# Patient Record
Sex: Male | Born: 1970 | Race: White | Hispanic: No | Marital: Married | Smoking: Current every day smoker
Health system: Southern US, Community
[De-identification: ages and names within clinical notes are randomized; demographics above are authoritative.]

---

## 2008-07-04 ENCOUNTER — Emergency Department (HOSPITAL_COMMUNITY): Admission: EM | Admit: 2008-07-04 | Discharge: 2008-07-04 | Payer: Self-pay | Admitting: Emergency Medicine

## 2010-05-08 ENCOUNTER — Emergency Department (HOSPITAL_COMMUNITY)
Admission: EM | Admit: 2010-05-08 | Discharge: 2010-05-08 | Payer: Self-pay | Source: Home / Self Care | Admitting: Emergency Medicine

## 2010-08-26 ENCOUNTER — Emergency Department (HOSPITAL_COMMUNITY)
Admission: EM | Admit: 2010-08-26 | Discharge: 2010-08-27 | Disposition: A | Discharge status: Home / Self Care | Payer: Self-pay | Attending: Emergency Medicine | Admitting: Emergency Medicine

## 2010-08-26 DIAGNOSIS — J45909 Unspecified asthma, uncomplicated: Secondary | ICD-10-CM | POA: Insufficient documentation

## 2011-03-05 ENCOUNTER — Emergency Department (HOSPITAL_COMMUNITY)
Admission: EM | Admit: 2011-03-05 | Discharge: 2011-03-05 | Disposition: A | Discharge status: Home / Self Care | Payer: Self-pay | Attending: Emergency Medicine | Admitting: Emergency Medicine

## 2011-03-05 DIAGNOSIS — R0602 Shortness of breath: Secondary | ICD-10-CM | POA: Insufficient documentation

## 2011-03-05 DIAGNOSIS — J45909 Unspecified asthma, uncomplicated: Secondary | ICD-10-CM | POA: Insufficient documentation

## 2011-03-16 ENCOUNTER — Emergency Department (HOSPITAL_COMMUNITY): Payer: Self-pay

## 2011-03-16 ENCOUNTER — Emergency Department (HOSPITAL_COMMUNITY)
Admission: EM | Admit: 2011-03-16 | Discharge: 2011-03-16 | Disposition: A | Discharge status: Home / Self Care | Payer: Self-pay | Attending: Emergency Medicine | Admitting: Emergency Medicine

## 2011-03-16 DIAGNOSIS — J45909 Unspecified asthma, uncomplicated: Secondary | ICD-10-CM | POA: Insufficient documentation

## 2011-05-02 ENCOUNTER — Emergency Department (HOSPITAL_COMMUNITY)
Admission: EM | Admit: 2011-05-02 | Discharge: 2011-05-02 | Disposition: A | Discharge status: Home / Self Care | Payer: Self-pay | Attending: Emergency Medicine | Admitting: Emergency Medicine

## 2011-05-02 ENCOUNTER — Other Ambulatory Visit: Payer: Self-pay

## 2011-05-02 ENCOUNTER — Encounter: Payer: Self-pay | Admitting: Emergency Medicine

## 2011-05-02 DIAGNOSIS — J45901 Unspecified asthma with (acute) exacerbation: Secondary | ICD-10-CM

## 2011-05-02 DIAGNOSIS — R0602 Shortness of breath: Secondary | ICD-10-CM | POA: Insufficient documentation

## 2011-05-02 DIAGNOSIS — R0789 Other chest pain: Secondary | ICD-10-CM | POA: Insufficient documentation

## 2011-05-02 MED ORDER — ALBUTEROL SULFATE HFA 108 (90 BASE) MCG/ACT IN AERS
2.0000 | INHALATION_SPRAY | Freq: Four times a day (QID) | RESPIRATORY_TRACT | Status: DC | PRN
  Administered 2011-05-02: 2 via RESPIRATORY_TRACT
  Filled 2011-05-02: qty 6.7

## 2011-05-02 MED ORDER — ALBUTEROL SULFATE (5 MG/ML) 0.5% IN NEBU
2.5000 mg | INHALATION_SOLUTION | Freq: Once | RESPIRATORY_TRACT | Status: AC
  Administered 2011-05-02: 5 mg via RESPIRATORY_TRACT
  Administered 2011-05-02: 02:00:00 via RESPIRATORY_TRACT

## 2011-05-02 MED ORDER — ALBUTEROL SULFATE (5 MG/ML) 0.5% IN NEBU
INHALATION_SOLUTION | RESPIRATORY_TRACT | Status: AC
  Filled 2011-05-02: qty 1

## 2011-05-02 MED ORDER — ALBUTEROL SULFATE HFA 108 (90 BASE) MCG/ACT IN AERS: 1.0000 | INHALATION_SPRAY | Freq: Four times a day (QID) | RESPIRATORY_TRACT | Status: AC | PRN

## 2011-05-02 NOTE — ED Provider Notes (Signed)
History     CSN: [id number] Arrival date & time: 05/02/2011  2:15 AM   First MD Initiated Contact with Patient 05/02/11 0303      Chief Complaint  Patient presents with  . Shortness of Breath    (Consider location/radiation/quality/duration/timing/severity/associated sxs/prior treatment) HPI Comments: Patient reports this is a mild asthma attack compared to other attacks. He does not smoke. He denies any fever, cold symptoms. He did not have an inhaler because he ran out and could not afford one at the time. He's reports he tried to drink warm liquids and use steam to see if he could get some relief but could not therefore had to come to the emergency department. He did receive a nebulizer treatment prior to my evaluation and the patient reports he feels much improved already. He does not think that he requires steroids with this attack. He did have mild chest tightness but it is consistent with his prior asthma attacks. He did have sweats or vomiting. Currently he does not have any significant chest tightness after his albuterol treatment.  Patient is a 40 y.o. male presenting with shortness of breath. The history is provided by the patient.  Shortness of Breath  The current episode started today. The problem is moderate. The symptoms are relieved by nothing. Associated symptoms include shortness of breath and wheezing.    Past Medical History  Diagnosis Date  . Asthma     History reviewed. No pertinent past surgical history.  History reviewed. No pertinent family history.  History  Substance Use Topics  . Smoking status: Never Smoker   . Smokeless tobacco: Not on file  . Alcohol Use: No      Review of Systems  Respiratory: Positive for chest tightness, shortness of breath and wheezing.   All other systems reviewed and are negative.    Allergies  Review of patient's allergies indicates no known allergies.  Home Medications   Current Outpatient Rx  Name Route Sig  Dispense Refill  . ALBUTEROL SULFATE HFA 108 (90 BASE) MCG/ACT IN AERS Inhalation Inhale 1-2 puffs into the lungs every 6 (six) hours as needed for wheezing. 1 Inhaler 0    BP 112/82  Pulse 89  Temp(Src) 97.6 F (36.4 C) (Axillary)  Resp 15  SpO2 94%  Physical Exam  Constitutional: He is oriented to person, place, and time. He appears well-developed and well-nourished.  HENT:  Head: Normocephalic and atraumatic.  Cardiovascular: Normal rate.   Pulmonary/Chest: Effort normal. He has wheezes.  Abdominal: Soft.  Neurological: He is alert and oriented to person, place, and time.  Skin: Skin is warm and dry. No rash noted.  Psychiatric: He has a normal mood and affect.    ED Course  Procedures (including critical care time)  Labs Reviewed - No data to display No results found.   1. Asthma attack       MDM    Wheezing is minmal, sats improved from low of 90% to 94% now on RA which is ok.  Will give inhaler and prescription for home as well.  I recommended that he get vaccine for flu.          Gavin Pound. Oletta Lamas, MD 05/02/11 [phone]

## 2011-05-02 NOTE — ED Notes (Signed)
INITIAL ALBUTEROL NEBULIZER TREATMENT GIVEN AT TRIAGE.

## 2011-05-02 NOTE — Discharge Instructions (Signed)
Asthma Attack Prevention HOW CAN ASTHMA BE PREVENTED? Currently, there is no way to prevent asthma from starting. However, you can take steps to control the disease and prevent its symptoms after you have been diagnosed. Learn about your asthma and how to control it. Take an active role to control your asthma by working with your caregiver to create and follow an asthma action plan. An asthma action plan guides you in taking your medicines properly, avoiding factors that make your asthma worse, tracking your level of asthma control, responding to worsening asthma, and seeking emergency care when needed. To track your asthma, keep records of your symptoms, check your peak flow number using a peak flow meter (handheld device that shows how well air moves out of your lungs), and get regular asthma checkups.  Other ways to prevent asthma attacks include:  Use medicines as your caregiver directs.   Identify and avoid things that make your asthma worse (as much as you can).   Keep track of your asthma symptoms and level of control.   Get regular checkups for your asthma.   With your caregiver, write a detailed plan for taking medicines and managing an asthma attack. Then be sure to follow your action plan. Asthma is an ongoing condition that needs regular monitoring and treatment.   Identify and avoid asthma triggers. A number of outdoor allergens and irritants (pollen, mold, cold air, air pollution) can trigger asthma attacks. Find out what causes or makes your asthma worse, and take steps to avoid those triggers (see below).   Monitor your breathing. Learn to recognize warning signs of an attack, such as slight coughing, wheezing or shortness of breath. However, your lung function may already decrease before you notice any signs or symptoms, so regularly measure and record your peak airflow with a home peak flow meter.   Identify and treat attacks early. If you act quickly, you're less likely to have  a severe attack. You will also need less medicine to control your symptoms. When your peak flow measurements decrease and alert you to an upcoming attack, take your medicine as instructed, and immediately stop any activity that may have triggered the attack. If your symptoms do not improve, get medical help.   Pay attention to increasing quick-relief inhaler use. If you find yourself relying on your quick-relief inhaler (such as albuterol), your asthma is not under control. See your caregiver about adjusting your treatment.  IDENTIFY AND CONTROL FACTORS THAT MAKE YOUR ASTHMA WORSE A number of common things can set off or make your asthma symptoms worse (asthma triggers). Keep track of your asthma symptoms for several weeks, detailing all the environmental and emotional factors that are linked with your asthma. When you have an asthma attack, go back to your asthma diary to see which factor, or combination of factors, might have contributed to it. Once you know what these factors are, you can take steps to control many of them.  Allergies: If you have allergies and asthma, it is important to take asthma prevention steps at home. Asthma attacks (worsening of asthma symptoms) can be triggered by allergies, which can cause temporary increased inflammation of your airways. Minimizing contact with the substance to which you are allergic will help prevent an asthma attack. Animal Dander:   Some people are allergic to the flakes of skin or dried saliva from animals with fur or feathers. Keep these pets out of your home.   If you can't keep a pet outdoors, keep the   pet out of your bedroom and other sleeping areas at all times, and keep the door closed.   Remove carpets and furniture covered with cloth from your home. If that is not possible, keep the pet away from fabric-covered furniture and carpets.  Dust Mites:  Many people with asthma are allergic to dust mites. Dust mites are tiny bugs that are found in  every home, in mattresses, pillows, carpets, fabric-covered furniture, bedcovers, clothes, stuffed toys, fabric, and other fabric-covered items.   Cover your mattress in a special dust-proof cover.   Cover your pillow in a special dust-proof cover, or wash the pillow each week in hot water. Water must be hotter than 130 F to kill dust mites. Cold or warm water used with detergent and bleach can also be effective.   Wash the sheets and blankets on your bed each week in hot water.   Try not to sleep or lie on cloth-covered cushions.   Call ahead when traveling and ask for a smoke-free hotel room. Bring your own bedding and pillows, in case the hotel only supplies feather pillows and down comforters, which may contain dust mites and cause asthma symptoms.   Remove carpets from your bedroom and those laid on concrete, if you can.   Keep stuffed toys out of the bed, or wash the toys weekly in hot water or cooler water with detergent and bleach.  Cockroaches:  Many people with asthma are allergic to the droppings and remains of cockroaches.   Keep food and garbage in closed containers. Never leave food out.   Use poison baits, traps, powders, gels, or paste (for example, boric acid).   If a spray is used to kill cockroaches, stay out of the room until the odor goes away.  Indoor Mold:  Fix leaky faucets, pipes, or other sources of water that have mold around them.   Clean moldy surfaces with a cleaner that has bleach in it.  Pollen and Outdoor Mold:  When pollen or mold spore counts are high, try to keep your windows closed.   Stay indoors with windows closed from late morning to afternoon, if you can. Pollen and some mold spore counts are highest at that time.   Ask your caregiver whether you need to take or increase anti-inflammatory medicine before your allergy season starts.  Irritants:   Tobacco smoke is an irritant. If you smoke, ask your caregiver how you can quit. Ask family  members to quit smoking, too. Do not allow smoking in your home or car.   If possible, do not use a wood-burning stove, kerosene heater, or fireplace. Minimize exposure to all sources of smoke, including incense, candles, fires, and fireworks.   Try to stay away from strong odors and sprays, such as perfume, talcum powder, hair spray, and paints.   Decrease humidity in your home and use an indoor air cleaning device. Reduce indoor humidity to below 60 percent. Dehumidifiers or central air conditioners can do this.   Try to have someone else vacuum for you once or twice a week, if you can. Stay out of rooms while they are being vacuumed and for a short while afterward.   If you vacuum, use a dust mask from a hardware store, a double-layered or microfilter vacuum cleaner bag, or a vacuum cleaner with a HEPA filter.   Sulfites in foods and beverages can be irritants. Do not drink beer or wine, or eat dried fruit, processed potatoes, or shrimp if they cause asthma   symptoms.   Cold air can trigger an asthma attack. Cover your nose and mouth with a scarf on cold or windy days.   Several health conditions can make asthma more difficult to manage, including runny nose, sinus infections, reflux disease, psychological stress, and sleep apnea. Your caregiver will treat these conditions, as well.   Avoid close contact with people who have a cold or the flu, since your asthma symptoms may get worse if you catch the infection from them. Wash your hands thoroughly after touching items that may have been handled by people with a respiratory infection.   Get a flu shot every year to protect against the flu virus, which often makes asthma worse for days or weeks. Also get a pneumonia shot once every five to 10 years.  Drugs:  Aspirin and other painkillers can cause asthma attacks. 10% to 20% of people with asthma have sensitivity to aspirin or a group of painkillers called non-steroidal anti-inflammatory drugs  (NSAIDS), such as ibuprofen and naproxen. These drugs are used to treat pain and reduce fevers. Asthma attacks caused by any of these medicines can be severe and even fatal. These drugs must be avoided in people who have known aspirin sensitive asthma. Products with acetaminophen are considered safe for people who have asthma. It is important that people with aspirin sensitivity read labels of all over-the-counter drugs used to treat pain, colds, coughs, and fever.   Beta blockers and ACE inhibitors are other drugs which you should discuss with your caregiver, in relation to your asthma.  ALLERGY SKIN TESTING  Ask your asthma caregiver about allergy skin testing or blood testing (RAST test) to identify the allergens to which you are sensitive. If you are found to have allergies, allergy shots (immunotherapy) for asthma may help prevent future allergies and asthma. With allergy shots, small doses of allergens (substances to which you are allergic) are injected under your skin on a regular schedule. Over a period of time, your body may become used to the allergen and less responsive with asthma symptoms. You can also take measures to minimize your exposure to those allergens. EXERCISE  If you have exercise-induced asthma, or are planning vigorous exercise, or exercise in cold, humid, or dry environments, prevent exercise-induced asthma by following your caregiver's advice regarding asthma treatment before exercising. Document Released: 04/23/2009 Document Revised: 01/15/2011 Document Reviewed: 04/23/2009 [employer] Patient Information 2012 [employer]. 

## 2011-05-02 NOTE — ED Notes (Signed)
PT. WOKE UP THIS MORNING WITH SOB / WHEEZING , DENIES COUGH OR CONGESTION .

## 2011-05-12 ENCOUNTER — Emergency Department (HOSPITAL_COMMUNITY)
Admission: EM | Admit: 2011-05-12 | Discharge: 2011-05-12 | Disposition: A | Discharge status: Home / Self Care | Payer: Self-pay | Attending: Emergency Medicine | Admitting: Emergency Medicine

## 2011-05-12 ENCOUNTER — Encounter (HOSPITAL_COMMUNITY): Payer: Self-pay

## 2011-05-12 DIAGNOSIS — R062 Wheezing: Secondary | ICD-10-CM | POA: Insufficient documentation

## 2011-05-12 DIAGNOSIS — R0602 Shortness of breath: Secondary | ICD-10-CM | POA: Insufficient documentation

## 2011-05-12 DIAGNOSIS — J9801 Acute bronchospasm: Secondary | ICD-10-CM | POA: Insufficient documentation

## 2011-05-12 MED ORDER — ALBUTEROL SULFATE (5 MG/ML) 0.5% IN NEBU
5.0000 mg | INHALATION_SOLUTION | Freq: Once | RESPIRATORY_TRACT | Status: AC
  Administered 2011-05-12: 5 mg via RESPIRATORY_TRACT
  Filled 2011-05-12: qty 1

## 2011-05-12 MED ORDER — ALBUTEROL SULFATE HFA 108 (90 BASE) MCG/ACT IN AERS
2.0000 | INHALATION_SPRAY | RESPIRATORY_TRACT | Status: DC
  Administered 2011-05-12: 2 via RESPIRATORY_TRACT
  Filled 2011-05-12: qty 6.7

## 2011-05-12 MED ORDER — IPRATROPIUM BROMIDE 0.02 % IN SOLN
0.5000 mg | Freq: Once | RESPIRATORY_TRACT | Status: AC
  Administered 2011-05-12: 0.5 mg via RESPIRATORY_TRACT
  Filled 2011-05-12: qty 2.5

## 2011-05-12 MED ORDER — PREDNISONE 20 MG PO TABS: 40.0000 mg | ORAL_TABLET | Freq: Every day | ORAL | Status: DC

## 2011-05-12 MED ORDER — PREDNISONE 20 MG PO TABS
60.0000 mg | ORAL_TABLET | Freq: Once | ORAL | Status: AC
  Administered 2011-05-12: 60 mg via ORAL
  Filled 2011-05-12: qty 3

## 2011-05-12 NOTE — ED Provider Notes (Signed)
History     CSN: [id number]  Arrival date & time 05/12/11  0010   First MD Initiated Contact with Patient 05/12/11 [phone]      Chief Complaint  Patient presents with  . Asthma    (Consider location/radiation/quality/duration/timing/severity/associated sxs/prior treatment) Patient is a 40 y.o. male presenting with shortness of breath. The history is provided by the patient.  Shortness of Breath  The current episode started today. Associated symptoms include shortness of breath and wheezing. Pertinent negatives include no chest pain, no fever, no sore throat and no cough. He was not exposed to toxic fumes. His past medical history is significant for asthma.  Pt states he has hx of asthma. No PCP. State began having shortness of breath, tightness, wheezing one hour ago. Ran out of his albuterol. Denies URI symptoms.   Past Medical History  Diagnosis Date  . Asthma     History reviewed. No pertinent past surgical history.  History reviewed. No pertinent family history.  History  Substance Use Topics  . Smoking status: Never Smoker   . Smokeless tobacco: Not on file  . Alcohol Use: No      Review of Systems  Constitutional: Negative for fever.  HENT: Negative for sore throat.   Respiratory: Positive for shortness of breath and wheezing. Negative for cough.   Cardiovascular: Negative for chest pain.  Gastrointestinal: Negative.   Genitourinary: Negative.   Musculoskeletal: Negative.   Skin: Negative.   Neurological: Negative.   Psychiatric/Behavioral: Negative.     Allergies  Review of patient's allergies indicates no known allergies.  Home Medications   Current Outpatient Rx  Name Route Sig Dispense Refill  . ALBUTEROL SULFATE HFA 108 (90 BASE) MCG/ACT IN AERS Inhalation Inhale 1-2 puffs into the lungs every 6 (six) hours as needed for wheezing. 1 Inhaler 0    BP 142/81  Pulse 111  Temp 98.6 F (37 C)  Resp 22  SpO2 92%  Physical Exam  Constitutional: He  is oriented to person, place, and time. He appears well-developed and well-nourished. No distress.  HENT:  Head: Normocephalic and atraumatic.  Right Ear: External ear normal.  Left Ear: External ear normal.  Nose: Nose normal.  Mouth/Throat: Oropharynx is clear and moist.  Eyes: Conjunctivae are normal.  Neck: Neck supple.  Cardiovascular: Normal rate and normal heart sounds.   Pulmonary/Chest: Effort normal. No respiratory distress. He has wheezes. He has no rales. He exhibits no tenderness.       Expiratory wheezing in all lung fields, decreased air movement bilaterally  Abdominal: Soft. Bowel sounds are normal. There is no tenderness.  Musculoskeletal: Normal range of motion.  Lymphadenopathy:    He has no cervical adenopathy.  Neurological: He is alert and oriented to person, place, and time.  Skin: Skin is warm and dry. No rash noted.  Psychiatric: He has a normal mood and affect.    ED Course  Procedures (including critical care time)  Pt with asthma attack. Denies any cold symptoms, no cough, no fever. Ran out of albuterol. Will administer nebs, prednisone, will monitor.  1:44 AM Pt improved with two nebs. VS now normal. Pt maintaining his oxygen sat. States feels much better. Will d/c home.   MDM          Lottie Mussel, PA 05/12/11 [phone]

## 2011-05-12 NOTE — ED Notes (Signed)
Pt presents to ED with shortness of breath and O2 saturations of 92 on RA, stating that he has been having an asthma attack x1 hour and does not have an inhaler at home.

## 2011-05-12 NOTE — ED Provider Notes (Signed)
Medical screening examination/treatment/procedure(s) were performed by non-physician practitioner and as supervising physician I was immediately available for consultation/collaboration.  Ethelda Chick, MD 05/12/11 [phone]

## 2019-08-15 ENCOUNTER — Observation Stay (HOSPITAL_COMMUNITY): Payer: No Typology Code available for payment source

## 2019-08-15 ENCOUNTER — Emergency Department (HOSPITAL_COMMUNITY): Payer: No Typology Code available for payment source

## 2019-08-15 ENCOUNTER — Observation Stay (HOSPITAL_BASED_OUTPATIENT_CLINIC_OR_DEPARTMENT_OTHER): Payer: No Typology Code available for payment source

## 2019-08-15 ENCOUNTER — Observation Stay (HOSPITAL_COMMUNITY)
Admission: EM | Admit: 2019-08-15 | Discharge: 2019-08-16 | Disposition: A | Discharge status: Home / Self Care | Payer: No Typology Code available for payment source | Attending: Internal Medicine | Admitting: Internal Medicine

## 2019-08-15 ENCOUNTER — Encounter (HOSPITAL_COMMUNITY): Payer: Self-pay | Admitting: Emergency Medicine

## 2019-08-15 ENCOUNTER — Other Ambulatory Visit: Payer: Self-pay

## 2019-08-15 DIAGNOSIS — R079 Chest pain, unspecified: Secondary | ICD-10-CM | POA: Insufficient documentation

## 2019-08-15 DIAGNOSIS — F1910 Other psychoactive substance abuse, uncomplicated: Secondary | ICD-10-CM | POA: Diagnosis present

## 2019-08-15 DIAGNOSIS — R109 Unspecified abdominal pain: Secondary | ICD-10-CM | POA: Diagnosis not present

## 2019-08-15 DIAGNOSIS — Z79899 Other long term (current) drug therapy: Secondary | ICD-10-CM | POA: Diagnosis not present

## 2019-08-15 DIAGNOSIS — R0602 Shortness of breath: Secondary | ICD-10-CM | POA: Insufficient documentation

## 2019-08-15 DIAGNOSIS — Z7982 Long term (current) use of aspirin: Secondary | ICD-10-CM | POA: Insufficient documentation

## 2019-08-15 DIAGNOSIS — R001 Bradycardia, unspecified: Secondary | ICD-10-CM | POA: Diagnosis not present

## 2019-08-15 DIAGNOSIS — R1032 Left lower quadrant pain: Secondary | ICD-10-CM | POA: Insufficient documentation

## 2019-08-15 DIAGNOSIS — F1020 Alcohol dependence, uncomplicated: Secondary | ICD-10-CM | POA: Insufficient documentation

## 2019-08-15 DIAGNOSIS — R471 Dysarthria and anarthria: Secondary | ICD-10-CM | POA: Diagnosis present

## 2019-08-15 DIAGNOSIS — J45909 Unspecified asthma, uncomplicated: Secondary | ICD-10-CM | POA: Insufficient documentation

## 2019-08-15 DIAGNOSIS — Z791 Long term (current) use of non-steroidal anti-inflammatories (NSAID): Secondary | ICD-10-CM | POA: Insufficient documentation

## 2019-08-15 DIAGNOSIS — I517 Cardiomegaly: Secondary | ICD-10-CM | POA: Insufficient documentation

## 2019-08-15 DIAGNOSIS — E876 Hypokalemia: Secondary | ICD-10-CM | POA: Diagnosis not present

## 2019-08-15 DIAGNOSIS — E785 Hyperlipidemia, unspecified: Secondary | ICD-10-CM | POA: Insufficient documentation

## 2019-08-15 DIAGNOSIS — Z7952 Long term (current) use of systemic steroids: Secondary | ICD-10-CM | POA: Diagnosis not present

## 2019-08-15 DIAGNOSIS — F1210 Cannabis abuse, uncomplicated: Secondary | ICD-10-CM | POA: Diagnosis not present

## 2019-08-15 DIAGNOSIS — Z720 Tobacco use: Secondary | ICD-10-CM | POA: Diagnosis present

## 2019-08-15 DIAGNOSIS — R4781 Slurred speech: Secondary | ICD-10-CM | POA: Diagnosis present

## 2019-08-15 DIAGNOSIS — Z20822 Contact with and (suspected) exposure to covid-19: Secondary | ICD-10-CM | POA: Insufficient documentation

## 2019-08-15 DIAGNOSIS — R29818 Other symptoms and signs involving the nervous system: Secondary | ICD-10-CM | POA: Diagnosis not present

## 2019-08-15 DIAGNOSIS — F17210 Nicotine dependence, cigarettes, uncomplicated: Secondary | ICD-10-CM | POA: Diagnosis not present

## 2019-08-15 DIAGNOSIS — F1410 Cocaine abuse, uncomplicated: Secondary | ICD-10-CM | POA: Diagnosis not present

## 2019-08-15 DIAGNOSIS — R2990 Unspecified symptoms and signs involving the nervous system: Secondary | ICD-10-CM | POA: Diagnosis not present

## 2019-08-15 DIAGNOSIS — R42 Dizziness and giddiness: Secondary | ICD-10-CM | POA: Diagnosis not present

## 2019-08-15 DIAGNOSIS — R2689 Other abnormalities of gait and mobility: Secondary | ICD-10-CM | POA: Insufficient documentation

## 2019-08-15 LAB — DIFFERENTIAL
Abs Immature Granulocytes: 0.02 10*3/uL (ref 0.00–0.07)
Basophils Absolute: 0.1 10*3/uL (ref 0.0–0.1)
Basophils Relative: 1 %
Eosinophils Absolute: 0.3 10*3/uL (ref 0.0–0.5)
Eosinophils Relative: 3 %
Immature Granulocytes: 0 %
Lymphocytes Relative: 18 %
Lymphs Abs: 1.8 10*3/uL (ref 0.7–4.0)
Monocytes Absolute: 0.9 10*3/uL (ref 0.1–1.0)
Monocytes Relative: 10 %
Neutro Abs: 6.6 10*3/uL (ref 1.7–7.7)
Neutrophils Relative %: 68 %

## 2019-08-15 LAB — URINALYSIS, ROUTINE W REFLEX MICROSCOPIC
Bilirubin Urine: NEGATIVE
Glucose, UA: NEGATIVE mg/dL
Hgb urine dipstick: NEGATIVE
Ketones, ur: NEGATIVE mg/dL
Leukocytes,Ua: NEGATIVE
Nitrite: NEGATIVE
Protein, ur: NEGATIVE mg/dL
Specific Gravity, Urine: 1.027 (ref 1.005–1.030)
pH: 5 (ref 5.0–8.0)

## 2019-08-15 LAB — COMPREHENSIVE METABOLIC PANEL
ALT: 20 U/L (ref 0–44)
AST: 13 U/L — ABNORMAL LOW (ref 15–41)
Albumin: 4.5 g/dL (ref 3.5–5.0)
Alkaline Phosphatase: 60 U/L (ref 38–126)
Anion gap: 12 (ref 5–15)
BUN: 22 mg/dL — ABNORMAL HIGH (ref 6–20)
CO2: 24 mmol/L (ref 22–32)
Calcium: 9.1 mg/dL (ref 8.9–10.3)
Chloride: 103 mmol/L (ref 98–111)
Creatinine, Ser: 1.05 mg/dL (ref 0.61–1.24)
GFR calc Af Amer: >60 mL/min (ref >60)
GFR calc non Af Amer: >60 mL/min (ref >60)
Glucose, Bld: 111 mg/dL — ABNORMAL HIGH (ref 70–99)
Potassium: 3.4 mmol/L — ABNORMAL LOW (ref 3.5–5.1)
Sodium: 139 mmol/L (ref 135–145)
Total Bilirubin: 0.4 mg/dL (ref 0.3–1.2)
Total Protein: 7.4 g/dL (ref 6.5–8.1)

## 2019-08-15 LAB — LIPID PANEL
Cholesterol: 207 mg/dL — ABNORMAL HIGH (ref 0–200)
HDL: 38 mg/dL — ABNORMAL LOW (ref >40)
LDL Cholesterol: 129 mg/dL — ABNORMAL HIGH (ref 0–99)
Total CHOL/HDL Ratio: 5.4 RATIO
Triglycerides: 198 mg/dL — ABNORMAL HIGH (ref <150)
VLDL: 40 mg/dL (ref 0–40)

## 2019-08-15 LAB — RESPIRATORY PANEL BY RT PCR (FLU A&B, COVID)
Influenza A by PCR: NEGATIVE
Influenza B by PCR: NEGATIVE
SARS Coronavirus 2 by RT PCR: NEGATIVE

## 2019-08-15 LAB — CBC
HCT: 47.2 % (ref 39.0–52.0)
Hemoglobin: 16 g/dL (ref 13.0–17.0)
MCH: 33.6 pg (ref 26.0–34.0)
MCHC: 33.9 g/dL (ref 30.0–36.0)
MCV: 99.2 fL (ref 80.0–100.0)
Platelets: 180 10*3/uL (ref 150–400)
RBC: 4.76 MIL/uL (ref 4.22–5.81)
RDW: 11.9 % (ref 11.5–15.5)
WBC: 7.6 10*3/uL (ref 4.0–10.5)
nRBC: 0 % (ref 0.0–0.2)

## 2019-08-15 LAB — RAPID URINE DRUG SCREEN, HOSP PERFORMED
Amphetamines: NOT DETECTED
Barbiturates: NOT DETECTED
Benzodiazepines: NOT DETECTED
Cocaine: POSITIVE — AB
Opiates: NOT DETECTED
Tetrahydrocannabinol: POSITIVE — AB

## 2019-08-15 LAB — ETHANOL: Alcohol, Ethyl (B): <10 mg/dL (ref <10)

## 2019-08-15 LAB — PROTIME-INR
INR: 0.9 (ref 0.8–1.2)
Prothrombin Time: 12.4 seconds (ref 11.4–15.2)

## 2019-08-15 LAB — HEMOGLOBIN A1C
Hgb A1c MFr Bld: 5.3 % (ref 4.8–5.6)
Mean Plasma Glucose: 105.41 mg/dL

## 2019-08-15 LAB — LIPASE, BLOOD: Lipase: 26 U/L (ref 11–51)

## 2019-08-15 LAB — D-DIMER, QUANTITATIVE: D-Dimer, Quant: <0.27 ug/mL-FEU (ref 0.00–0.50)

## 2019-08-15 LAB — APTT: aPTT: 30 seconds (ref 24–36)

## 2019-08-15 MED ORDER — SODIUM CHLORIDE 0.9% FLUSH
9.0000 mL | Freq: Once | INTRAVENOUS | Status: AC
  Administered 2019-08-15: 9 mL via INTRAVENOUS

## 2019-08-15 MED ORDER — ONDANSETRON 4 MG PO TBDP: 4.0000 mg | ORAL_TABLET | Freq: Once | ORAL | Status: DC | PRN

## 2019-08-15 MED ORDER — SODIUM CHLORIDE (PF) 0.9 % IJ SOLN
INTRAMUSCULAR | Status: AC
  Filled 2019-08-15: qty 100

## 2019-08-15 MED ORDER — ENOXAPARIN SODIUM 40 MG/0.4ML ~~LOC~~ SOLN
40.0000 mg | SUBCUTANEOUS | Status: DC
  Administered 2019-08-15: 40 mg via SUBCUTANEOUS
  Filled 2019-08-15: qty 0.4

## 2019-08-15 MED ORDER — IOHEXOL 350 MG/ML SOLN
150.0000 mL | Freq: Once | INTRAVENOUS | Status: AC | PRN
  Administered 2019-08-15: 150 mL via INTRAVENOUS

## 2019-08-15 MED ORDER — SODIUM CHLORIDE 0.9% FLUSH
3.0000 mL | Freq: Once | INTRAVENOUS | Status: AC
  Administered 2019-08-15: 05:00:00 3 mL via INTRAVENOUS

## 2019-08-15 NOTE — H&P (Signed)
History and Physical  Justin Hardy FMB:[id number] DOB: 07-18-1970 DOA: 08/15/2019  Referring physician: Harlow Mares, ER physician PCP: Patient, No Pcp Per  Outpatient Specialists: None Patient coming from: Home & is able to ambulate without assistance  Chief Complaint: Dizziness, trouble speaking  HPI: Justin Hardy is a 49 y.o. male with medical history significant for polysubstance abuse including drugs, alcohol and tobacco use who was in his usual state of health and feeling okay when he got up in the middle the night to let his dog out to use the bathroom.  Afterwards, he was tending to the dog when all of a sudden he stood up and felt very lightheaded.  He sat down hoping that that would improve his symptoms, but this did not help.  He tried to tell his wife and found it difficult to communicate.  He knew what he wanted to say, but could not get the words out.  His wife became concerned and drove him to the emergency room.  En route, he felt like he had difficulty with motor skills was not able to change the temperature in the car.  His wife had to do this for him.  ED Course: In the emergency room, patient underwent a code stroke.  CT scan of the head was unremarkable.  It was noted on arrival, but his blood pressure was stable with a BP of 137/96 and pulse of 91 initially but since then has come down and at times bradycardic.  Patient's stated that by the time he waited to the emergency room and was talked to them ER doctor, he said he felt almost completely back to normal in the time he got to the floor, he feels 100% back to normal.  Patent seen by neurology and underwent a CT angiogram of the head and neck which was unremarkable.  Neurology recommended full stroke work-up.  Patient's urine drug screen positive for THC and cocaine.   On an unrelated issue, patient tells me that for the last month, his left lower quadrant has been giving him some discomfort.  It is intermittent, described  as a dull ache and ranges from mild 3 out of 10 to 7/10.  Does not go anywhere else, although at times he can feel it also in his left flank.  Nothing makes it better or worse.  Does not appear to be positional.  Does not improve or get worse whenever he urinates or moves his bowels.  Does not affect his eating habits.  Review of Systems: Patient seen after arrival to floor. Pt complains of some lower left lower quadrant pain and left flank pain, currently mild at about a 4/10.  Pt denies any headaches, vision changes, dysphagia, trouble speaking, chest pain, palpitations, shortness of breath, wheeze, cough, hematuria, dysuria, constipation, diarrhea, focal extremity numbness weakness or pain.  Review of systems are otherwise negative   Past Medical History:  Diagnosis Date  . Asthma    History reviewed. No pertinent surgical history.  Social History:  reports that he has been smoking cigarettes. He has never used smokeless tobacco.  He states that when he goes out with his friends, sometimes he can drink heavily but this is only on some weekends.  He feels like he needs to cut back.  He did not initially admit to drug use, but when his urine drug screen came back positive for marijuana cocaine, he admitted to stating that he had last done cocaine several days ago.  Lives at home with  his wife, able to ambulate without assistance   No Known Allergies  Family history: Dad with cancer, patient unsure what kind  Prior to Admission medications   Medication Sig Start Date End Date Taking? Authorizing Provider  albuterol (PROVENTIL HFA;VENTOLIN HFA) 108 (90 BASE) MCG/ACT inhaler Inhale 1-2 puffs into the lungs every 6 (six) hours as needed for wheezing. 05/02/11 08/15/19 Yes Quita Skye, MD  ibuprofen (ADVIL) 200 MG tablet Take 400 mg by mouth every 6 (six) hours as needed for headache or moderate pain.   Yes [provider]  predniSONE (DELTASONE) 20 MG tablet Take 2 tablets (40 mg  total) by mouth daily. Patient not taking: Reported on 08/15/2019 05/12/11   Jaynie Crumble, PA-C    Physical Exam: BP 102/63 (BP Location: Right Arm)   Pulse (!) 50   Temp 97.8 F (36.6 C) (Oral)   Resp 14   Ht 6' (1.829 m)   Wt 78.2 kg   SpO2 97%   BMI 23.40 kg/m   General: Alert and oriented x3, no acute distress Eyes: Sclera nonicteric extraocular movements are intact ENT: Normocephalic and atraumatic, mucous membranes are moist Neck: Supple, no JVD Cardiovascular: Mild bradycardia, regular rhythm Respiratory: Clear to auscultation bilaterally Abdomen: Soft, nontender, nondistended, positive bowel sounds Skin: No skin breaks, tears or lesions Musculoskeletal: No clubbing or cyanosis or edema Psychiatric: Appropriate, no evidence of psychoses Neurologic: No focal deficits, cranial nerves II through XII are intact.  Normal finger-to-nose, downgoing toes          Labs on Admission:  Basic Metabolic Panel: Recent Labs  Lab 08/15/19 0305  NA 139  K 3.4*  CL 103  CO2 24  GLUCOSE 111*  BUN 22*  CREATININE 1.05  CALCIUM 9.1   Liver Function Tests: Recent Labs  Lab 08/15/19 0305  AST 13*  ALT 20  ALKPHOS 60  BILITOT 0.4  PROT 7.4  ALBUMIN 4.5   Recent Labs  Lab 08/15/19 0305  LIPASE 26   No results for input(s): AMMONIA in the last 168 hours. CBC: Recent Labs  Lab 08/15/19 0305 08/15/19 0427  WBC 7.6  --   NEUTROABS  --  6.6  HGB 16.0  --   HCT 47.2  --   MCV 99.2  --   PLT 180  --    Cardiac Enzymes: No results for input(s): CKTOTAL, CKMB, CKMBINDEX, TROPONINI in the last 168 hours.  BNP (last 3 results) No results for input(s): BNP in the last 8760 hours.  ProBNP (last 3 results) No results for input(s): PROBNP in the last 8760 hours.  CBG: No results for input(s): GLUCAP in the last 168 hours.  Radiological Exams on Admission: CT Angio Head W or Wo Contrast  Result Date: 08/15/2019 CLINICAL DATA:  Slurred speech with suspected  neuro deficit. EXAM: CT ANGIOGRAPHY HEAD AND NECK TECHNIQUE: Multidetector CT imaging of the head and neck was performed using the standard protocol during bolus administration of intravenous contrast. Multiplanar CT image reconstructions and MIPs were obtained to evaluate the vascular anatomy. Carotid stenosis measurements (when applicable) are obtained utilizing NASCET criteria, using the distal internal carotid diameter as the denominator. CONTRAST:  OMNIPAQUE IOHEXOL 350 MG/ML SOLN COMPARISON:  None. FINDINGS: CTA NECK FINDINGS Aortic arch: Normal.  Three vessel branching. Right carotid system: Vessels are smooth and widely patent. No atheromatous changes or beading. Left carotid system: Vessels are smooth and widely patent. There is distal ICA looping without beading. No noted atheromatous changes. Vertebral arteries: No  proximal subclavian stenosis or atheromatous change. Essentially codominant vertebral arteries. Vessels are smooth and widely patent. Skeleton: Lower cervical degenerative disc narrowing and ridging. Other neck: Negative Upper chest: Reported separately Review of the MIP images confirms the above findings CTA HEAD FINDINGS Anterior circulation: No atheromatous changes, beading, branch occlusion, or aneurysm. Posterior circulation: The vertebral and basilar arteries are smooth and widely patent. No branch occlusion, beading, or aneurysm Venous sinuses: Diffusely patent Anatomic variants: None significant Review of the MIP images confirms the above findings IMPRESSION: Negative CTA of the head and neck. Electronically Signed   By: Marnee Spring M.D.   On: 08/15/2019 05:41   DG Chest 2 View  Result Date: 08/15/2019 CLINICAL DATA:  Shortness of breath EXAM: CHEST - 2 VIEW COMPARISON:  03/16/2011 FINDINGS: The heart size and mediastinal contours are within normal limits. Both lungs are clear. The visualized skeletal structures are unremarkable. IMPRESSION: No active cardiopulmonary  disease. Electronically Signed   By: Alcide Clever M.D.   On: 08/15/2019 03:21   CT Angio Neck W and/or Wo Contrast  Result Date: 08/15/2019 CLINICAL DATA:  Slurred speech with suspected neuro deficit. EXAM: CT ANGIOGRAPHY HEAD AND NECK TECHNIQUE: Multidetector CT imaging of the head and neck was performed using the standard protocol during bolus administration of intravenous contrast. Multiplanar CT image reconstructions and MIPs were obtained to evaluate the vascular anatomy. Carotid stenosis measurements (when applicable) are obtained utilizing NASCET criteria, using the distal internal carotid diameter as the denominator. CONTRAST:  OMNIPAQUE IOHEXOL 350 MG/ML SOLN COMPARISON:  None. FINDINGS: CTA NECK FINDINGS Aortic arch: Normal.  Three vessel branching. Right carotid system: Vessels are smooth and widely patent. No atheromatous changes or beading. Left carotid system: Vessels are smooth and widely patent. There is distal ICA looping without beading. No noted atheromatous changes. Vertebral arteries: No proximal subclavian stenosis or atheromatous change. Essentially codominant vertebral arteries. Vessels are smooth and widely patent. Skeleton: Lower cervical degenerative disc narrowing and ridging. Other neck: Negative Upper chest: Reported separately Review of the MIP images confirms the above findings CTA HEAD FINDINGS Anterior circulation: No atheromatous changes, beading, branch occlusion, or aneurysm. Posterior circulation: The vertebral and basilar arteries are smooth and widely patent. No branch occlusion, beading, or aneurysm Venous sinuses: Diffusely patent Anatomic variants: None significant Review of the MIP images confirms the above findings IMPRESSION: Negative CTA of the head and neck. Electronically Signed   By: Marnee Spring M.D.   On: 08/15/2019 05:41   CT Angio Chest/Abd/Pel for Dissection W and/or Wo Contrast  Result Date: 08/15/2019 CLINICAL DATA:  Flank pain with kidney  stone suspected. Chest pain and slurred speech per prior notes. EXAM: CT ANGIOGRAPHY CHEST, ABDOMEN AND PELVIS TECHNIQUE: Multidetector CT imaging through the chest, abdomen and pelvis was performed using the standard protocol during bolus administration of intravenous contrast. Multiplanar reconstructed images and MIPs were obtained and reviewed to evaluate the vascular anatomy. CONTRAST:  OMNIPAQUE IOHEXOL 350 MG/ML SOLN COMPARISON:  None. FINDINGS: CTA CHEST FINDINGS Cardiovascular: The noncontrast phase has intravascular contrast related to prior CTA. No evidence of intramural hematoma. Negative for aortic dissection or aneurysm. No visible pulmonary artery filling defects. Normal heart size. No pericardial effusion. Mediastinum/Nodes: Negative for adenopathy or hematoma. No pneumomediastinum. Lungs/Pleura: Airway thickening. There is no edema, consolidation, effusion, or pneumothorax. Musculoskeletal: No acute finding Review of the MIP images confirms the above findings. CTA ABDOMEN AND PELVIS FINDINGS VASCULAR Aorta: Minimal atheromatous wall thickening. No aneurysm or dissection. Celiac: Vessels are  smooth and widely patent SMA: Vessels are smooth and widely patent Renals: Single bilateral renal arteries that are smooth and widely patent. Negative for aneurysm. IMA: Patent Inflow: Vessels are smooth and widely patent. Veins: Negative Review of the MIP images confirms the above findings. NON-VASCULAR Hepatobiliary: 3 small cystic densities within the liver.No evidence of biliary obstruction or stone. Pancreas: Unremarkable. Spleen: Unremarkable. Adrenals/Urinary Tract: Negative adrenals. No hydronephrosis or obstructing stone. There is excreting contrast related to prior neck CTA, which could obscure small calculi. Unremarkable bladder. Stomach/Bowel:  No obstruction. No appendicitis. Lymphatic: No mass or adenopathy. Reproductive:No pathologic findings. Other: No ascites or pneumoperitoneum.  Musculoskeletal: No acute abnormalities. Review of the MIP images confirms the above findings. IMPRESSION: 1. Negative for acute aortic syndrome or other explanation for symptoms. 2. Minimal atheromatous changes. Electronically Signed   By: Marnee SpringJonathon  Watts M.D.   On: 08/15/2019 05:47   CT HEAD CODE STROKE WO CONTRAST  Result Date: 08/15/2019 CLINICAL DATA:  Code stroke. Shortness of breath and back/abdominal pain. Slurred speech EXAM: CT HEAD WITHOUT CONTRAST TECHNIQUE: Contiguous axial images were obtained from the base of the skull through the vertex without intravenous contrast. COMPARISON:  None. FINDINGS: Brain: No evidence of acute infarction, hemorrhage, hydrocephalus, extra-axial collection or mass lesion/mass effect. Vascular: No hyperdense vessel or unexpected calcification. Skull: Normal. Negative for fracture or focal lesion. Sinuses/Orbits: Negative Other: There is underway CTA of the chest, head, and neck. ASPECTS [facility]([place] Stroke Program Early CT Score) - Ganglionic level infarction (caudate, lentiform nuclei, internal capsule, insula, M1-M3 cortex): 7 - Supraganglionic infarction (M4-M6 cortex): 3 Total score (0-10 with 10 being normal): 10 IMPRESSION: Negative head CT.  ASPECTS is 10. Electronically Signed   By: Marnee SpringJonathon  Watts M.D.   On: 08/15/2019 04:49    EKG: Independently reviewed.  Sinus rhythm with questionable RSR' in V1 or V2, no change from previous  Assessment/Plan Present on Admission: . Neurological symptoms including dysarthria, dizziness.  Symptoms appear to have resolved.  And less likely to think that this is a TIA given age, lack of elevated blood pressure, few risk factors.  Questionable cerebral vasospasm from cocaine?  CT angiogram unremarkable.  MRI and Dopplers pending . Asthma: Stable.  . Bradycardia: Continue to monitor.  Heart rate in the mid 50s.  Asymptomatic from this.  . Polysubstance abuse (HCC): Urine positive for THC and cocaine.  Discussed with  patient about dangers and how cocaine may have led to the symptoms.  He tells me that he really needs to quit.  . Alcohol dependence, binge pattern [facility](HCC): Patient states that he is already started trying to quit drinking.  . Tobacco abuse: Offered nicotine patch.  He declined.  . Left flank pain: Ongoing issue for the last month.  Patient initially complains of left lower quadrant abdominal pain which appears to be more radiation from flank.  No tenderness with palpation of his abdomen, but he does indeed feel pain with mild taps to left flank.  Urine is clean, but I suspect that this is from a kidney stone given presentation.  Does not appear to be diverticulitis.  Encourage hydration and have started IV fluids.  Principal Problem:   Neurological symptoms Active Problems:   Asthma   Dizziness   Dysarthria   Bradycardia   Polysubstance abuse (HCC)   Alcohol dependence, binge pattern (HCC)   Tobacco abuse   Left flank pain   DVT prophylaxis: Lovenox  Code Status: Full code  Family Communication: Patient declined for me to  talk to wife, especially in lieu of a positive drug screen  Disposition Plan: Discharge once work-up complete, potentially later today or tomorrow  Consults called: Neurology  Admission status: Given expectation that patient will be here under 2 midnights    Hollice Espy MD [facility] Pager [phone]  If 7PM-7AM, please contact night-coverage [email or web address] Password [facility]  08/15/2019, 12:25 PM

## 2019-08-15 NOTE — ED Triage Notes (Signed)
Patient is complaining of sob, back pain, and abdominal. Patient states the back pain and sob started tonight. Patient states the abdomen pain started a week ago.

## 2019-08-15 NOTE — ED Provider Notes (Signed)
[facility] DEPT Provider Note   CSN: [id number] Arrival date & time: 08/15/19  0245     History Chief Complaint  Patient presents with  . Abdominal Pain  . Shortness of Breath    Justin Hardy is a 49 y.o. male.  Patient has intermittent left-sided flank pain for the past several weeks.  Today while he was bending down to wipe off the dog's feet around 230 this morning he became acutely dizzy and lightheaded with difficulty speaking.  He states this is starting to improve.  He describes difficulty getting his words out with slurred speech and believes that he sounds drunk though he was not drinking tonight.  He denies any focal weakness in his arms or legs.  No problems with his vision.  No headache.  No chest pain or shortness of breath. He states this acutely onset about 230 this morning.  He was normal when he went to bed at 1030. He is scheduled to see somebody about his side pain later this week.  He denies any pain with urination or blood in the urine.  No chest pain or shortness of breath. Level 5 caveat for acuity of condition.  The history is provided by the patient.  Abdominal Pain Associated symptoms: shortness of breath   Associated symptoms: no chest pain, no dysuria, no fever, no hematuria, no nausea and no vomiting   Shortness of Breath Associated symptoms: abdominal pain   Associated symptoms: no chest pain, no fever, no rash and no vomiting        Past Medical History:  Diagnosis Date  . Asthma     There are no problems to display for this patient.   History reviewed. No pertinent surgical history.     History reviewed. No pertinent family history.  Social History   Tobacco Use  . Smoking status: Current Every Day Smoker    Types: Cigarettes  . Smokeless tobacco: Never Used  Substance Use Topics  . Alcohol use: No  . Drug use: No    Home Medications Prior to Admission medications   Medication Sig Start Date  End Date Taking? Authorizing Provider  albuterol (PROVENTIL HFA;VENTOLIN HFA) 108 (90 BASE) MCG/ACT inhaler Inhale 1-2 puffs into the lungs every 6 (six) hours as needed for wheezing. 05/02/11 05/01/12  Kingsley Spittle, MD  predniSONE (DELTASONE) 20 MG tablet Take 2 tablets (40 mg total) by mouth daily. 05/12/11   Jeannett Senior, PA-C    Allergies    Patient has no known allergies.  Review of Systems   Review of Systems  Constitutional: Negative for activity change, appetite change and fever.  HENT: Negative for congestion and rhinorrhea.   Respiratory: Positive for shortness of breath. Negative for chest tightness.   Cardiovascular: Negative for chest pain.  Gastrointestinal: Positive for abdominal pain. Negative for nausea and vomiting.  Genitourinary: Negative for dysuria and hematuria.  Musculoskeletal: Positive for back pain.  Skin: Negative for rash.  Neurological: Positive for dizziness, speech difficulty, weakness and numbness.    all other systems are negative except as noted in the HPI and PMH.   Physical Exam Updated Vital Signs BP (!) 137/96 (BP Location: Left Arm)   Pulse 91   Temp 98.2 F (36.8 C) (Oral)   Resp (!) 22   Ht 6' (1.829 m)   Wt 77.1 kg   SpO2 100%   BMI 23.06 kg/m   Physical Exam Vitals and nursing note reviewed.  Constitutional:  General: He is not in acute distress.    Appearance: He is well-developed.     Comments: Slurred speech  HENT:     Head: Normocephalic and atraumatic.     Mouth/Throat:     Pharynx: No oropharyngeal exudate.  Eyes:     Conjunctiva/sclera: Conjunctivae normal.     Pupils: Pupils are equal, round, and reactive to light.  Neck:     Comments: No meningismus. Cardiovascular:     Rate and Rhythm: Normal rate and regular rhythm.     Heart sounds: Normal heart sounds. No murmur.  Pulmonary:     Effort: Pulmonary effort is normal. No respiratory distress.     Breath sounds: Normal breath sounds.  Abdominal:      Palpations: Abdomen is soft.     Tenderness: There is no abdominal tenderness. There is no guarding or rebound.  Musculoskeletal:        General: No tenderness. Normal range of motion.     Cervical back: Normal range of motion and neck supple.  Skin:    General: Skin is warm.  Neurological:     Mental Status: He is alert and oriented to person, place, and time.     Cranial Nerves: No cranial nerve deficit.     Motor: No abnormal muscle tone.     Coordination: Coordination normal.     Comments: CN 2-12 intact, no ataxia on finger to nose, no nystagmus, 5/5 strength throughout, no pronator drift,  Cranial nerves II to XII intact, no ataxia on finger-to-nose, no nystagmus, 5/5 strength throughout, no pronator drift.  Gait and romberg not tested  Psychiatric:        Behavior: Behavior normal.     ED Results / Procedures / Treatments   Labs (all labs ordered are listed, but only abnormal results are displayed) Labs Reviewed  COMPREHENSIVE METABOLIC PANEL - Abnormal; Notable for the following components:      Result Value   Potassium 3.4 (*)    Glucose, Bld 111 (*)    BUN 22 (*)    AST 13 (*)    All other components within normal limits  RAPID URINE DRUG SCREEN, HOSP PERFORMED - Abnormal; Notable for the following components:   Cocaine POSITIVE (*)    Tetrahydrocannabinol POSITIVE (*)    All other components within normal limits  RESPIRATORY PANEL BY RT PCR (FLU A&B, COVID)  LIPASE, BLOOD  CBC  URINALYSIS, ROUTINE W REFLEX MICROSCOPIC  ETHANOL  PROTIME-INR  APTT  DIFFERENTIAL  D-DIMER, QUANTITATIVE (NOT AT [facility])    EKG EKG Interpretation  Date/Time:  Monday August 15 2019 03:02:44 EDT Ventricular Rate:  81 PR Interval:    QRS Duration: 108 QT Interval:  373 QTC Calculation: 433 R Axis:   11 Text Interpretation: Sinus rhythm RSR' in V1 or V2, right VCD or RVH 12 Lead; Mason-Likar No significant change was found Confirmed by Glynn Octave [phone]) on 08/15/2019  3:15:48 AM   Radiology CT Angio Head W or Wo Contrast  Result Date: 08/15/2019 CLINICAL DATA:  Slurred speech with suspected neuro deficit. EXAM: CT ANGIOGRAPHY HEAD AND NECK TECHNIQUE: Multidetector CT imaging of the head and neck was performed using the standard protocol during bolus administration of intravenous contrast. Multiplanar CT image reconstructions and MIPs were obtained to evaluate the vascular anatomy. Carotid stenosis measurements (when applicable) are obtained utilizing NASCET criteria, using the distal internal carotid diameter as the denominator. CONTRAST:  OMNIPAQUE IOHEXOL 350 MG/ML SOLN COMPARISON:  None. FINDINGS: CTA  NECK FINDINGS Aortic arch: Normal.  Three vessel branching. Right carotid system: Vessels are smooth and widely patent. No atheromatous changes or beading. Left carotid system: Vessels are smooth and widely patent. There is distal ICA looping without beading. No noted atheromatous changes. Vertebral arteries: No proximal subclavian stenosis or atheromatous change. Essentially codominant vertebral arteries. Vessels are smooth and widely patent. Skeleton: Lower cervical degenerative disc narrowing and ridging. Other neck: Negative Upper chest: Reported separately Review of the MIP images confirms the above findings CTA HEAD FINDINGS Anterior circulation: No atheromatous changes, beading, branch occlusion, or aneurysm. Posterior circulation: The vertebral and basilar arteries are smooth and widely patent. No branch occlusion, beading, or aneurysm Venous sinuses: Diffusely patent Anatomic variants: None significant Review of the MIP images confirms the above findings IMPRESSION: Negative CTA of the head and neck. Electronically Signed   By: Marnee SpringJonathon  Watts M.D.   On: 08/15/2019 05:41   DG Chest 2 View  Result Date: 08/15/2019 CLINICAL DATA:  Shortness of breath EXAM: CHEST - 2 VIEW COMPARISON:  03/16/2011 FINDINGS: The heart size and mediastinal contours are within  normal limits. Both lungs are clear. The visualized skeletal structures are unremarkable. IMPRESSION: No active cardiopulmonary disease. Electronically Signed   By: Alcide CleverMark  Lukens M.D.   On: 08/15/2019 03:21   CT Angio Neck W and/or Wo Contrast  Result Date: 08/15/2019 CLINICAL DATA:  Slurred speech with suspected neuro deficit. EXAM: CT ANGIOGRAPHY HEAD AND NECK TECHNIQUE: Multidetector CT imaging of the head and neck was performed using the standard protocol during bolus administration of intravenous contrast. Multiplanar CT image reconstructions and MIPs were obtained to evaluate the vascular anatomy. Carotid stenosis measurements (when applicable) are obtained utilizing NASCET criteria, using the distal internal carotid diameter as the denominator. CONTRAST:  150mL OMNIPAQUE IOHEXOL 350 MG/ML SOLN COMPARISON:  None. FINDINGS: CTA NECK FINDINGS Aortic arch: Normal.  Three vessel branching. Right carotid system: Vessels are smooth and widely patent. No atheromatous changes or beading. Left carotid system: Vessels are smooth and widely patent. There is distal ICA looping without beading. No noted atheromatous changes. Vertebral arteries: No proximal subclavian stenosis or atheromatous change. Essentially codominant vertebral arteries. Vessels are smooth and widely patent. Skeleton: Lower cervical degenerative disc narrowing and ridging. Other neck: Negative Upper chest: Reported separately Review of the MIP images confirms the above findings CTA HEAD FINDINGS Anterior circulation: No atheromatous changes, beading, branch occlusion, or aneurysm. Posterior circulation: The vertebral and basilar arteries are smooth and widely patent. No branch occlusion, beading, or aneurysm Venous sinuses: Diffusely patent Anatomic variants: None significant Review of the MIP images confirms the above findings IMPRESSION: Negative CTA of the head and neck. Electronically Signed   By: Marnee SpringJonathon  Watts M.D.   On: 08/15/2019 05:41    CT Angio Chest/Abd/Pel for Dissection W and/or Wo Contrast  Result Date: 08/15/2019 CLINICAL DATA:  Flank pain with kidney stone suspected. Chest pain and slurred speech per prior notes. EXAM: CT ANGIOGRAPHY CHEST, ABDOMEN AND PELVIS TECHNIQUE: Multidetector CT imaging through the chest, abdomen and pelvis was performed using the standard protocol during bolus administration of intravenous contrast. Multiplanar reconstructed images and MIPs were obtained and reviewed to evaluate the vascular anatomy. CONTRAST:  150mL OMNIPAQUE IOHEXOL 350 MG/ML SOLN COMPARISON:  None. FINDINGS: CTA CHEST FINDINGS Cardiovascular: The noncontrast phase has intravascular contrast related to prior CTA. No evidence of intramural hematoma. Negative for aortic dissection or aneurysm. No visible pulmonary artery filling defects. Normal heart size. No pericardial effusion. Mediastinum/Nodes: Negative for adenopathy  or hematoma. No pneumomediastinum. Lungs/Pleura: Airway thickening. There is no edema, consolidation, effusion, or pneumothorax. Musculoskeletal: No acute finding Review of the MIP images confirms the above findings. CTA ABDOMEN AND PELVIS FINDINGS VASCULAR Aorta: Minimal atheromatous wall thickening. No aneurysm or dissection. Celiac: Vessels are smooth and widely patent SMA: Vessels are smooth and widely patent Renals: Single bilateral renal arteries that are smooth and widely patent. Negative for aneurysm. IMA: Patent Inflow: Vessels are smooth and widely patent. Veins: Negative Review of the MIP images confirms the above findings. NON-VASCULAR Hepatobiliary: 3 small cystic densities within the liver.No evidence of biliary obstruction or stone. Pancreas: Unremarkable. Spleen: Unremarkable. Adrenals/Urinary Tract: Negative adrenals. No hydronephrosis or obstructing stone. There is excreting contrast related to prior neck CTA, which could obscure small calculi. Unremarkable bladder. Stomach/Bowel:  No obstruction. No  appendicitis. Lymphatic: No mass or adenopathy. Reproductive:No pathologic findings. Other: No ascites or pneumoperitoneum. Musculoskeletal: No acute abnormalities. Review of the MIP images confirms the above findings. IMPRESSION: 1. Negative for acute aortic syndrome or other explanation for symptoms. 2. Minimal atheromatous changes. Electronically Signed   By: Marnee Spring M.D.   On: 08/15/2019 05:47   CT HEAD CODE STROKE WO CONTRAST  Result Date: 08/15/2019 CLINICAL DATA:  Code stroke. Shortness of breath and back/abdominal pain. Slurred speech EXAM: CT HEAD WITHOUT CONTRAST TECHNIQUE: Contiguous axial images were obtained from the base of the skull through the vertex without intravenous contrast. COMPARISON:  None. FINDINGS: Brain: No evidence of acute infarction, hemorrhage, hydrocephalus, extra-axial collection or mass lesion/mass effect. Vascular: No hyperdense vessel or unexpected calcification. Skull: Normal. Negative for fracture or focal lesion. Sinuses/Orbits: Negative Other: There is underway CTA of the chest, head, and neck. ASPECTS [facility] Stroke Program Early CT Score) - Ganglionic level infarction (caudate, lentiform nuclei, internal capsule, insula, M1-M3 cortex): 7 - Supraganglionic infarction (M4-M6 cortex): 3 Total score (0-10 with 10 being normal): 10 IMPRESSION: Negative head CT.  ASPECTS is 10. Electronically Signed   By: Marnee Spring M.D.   On: 08/15/2019 04:49    Procedures .Critical Care Performed by: Glynn Octave, MD Authorized by: Glynn Octave, MD   Critical care provider statement:    Critical care time (minutes):  45   Critical care was necessary to treat or prevent imminent or life-threatening deterioration of the following conditions:  CNS failure or compromise   Critical care was time spent personally by me on the following activities:  Discussions with consultants, evaluation of patient's response to treatment, examination of patient, ordering and  performing treatments and interventions, ordering and review of laboratory studies, ordering and review of radiographic studies, pulse oximetry, re-evaluation of patient's condition, obtaining history from patient or surrogate and review of old charts   (including critical care time)  Medications Ordered in ED Medications  sodium chloride flush (NS) 0.9 % injection 3 mL (has no administration in time range)  ondansetron (ZOFRAN-ODT) disintegrating tablet 4 mg (has no administration in time range)    ED Course  I have reviewed the triage vital signs and the nursing notes.  Pertinent labs & imaging results that were available during my care of the patient were reviewed by me and considered in my medical decision making (see chart for details).    MDM Rules/Calculators/A&P                     Patient with sudden onset difficulty speaking and dizziness and lightheadedness.  Last seen normal was approximately 10:30 PM when he went to  bed.  Symptoms are noticed about 2:30 AM.  Patient states he woke up normal about 2:30 AM but then shortly noticed these symptoms after he wiped off the dog's feet.  Code stroke activated on initial assessment.  CT head negative for hemorrhage or large infarct.  CTA is negative for large vessel occlusion.  Seen by Dr. Dierdre Searles neurology.  He does not recommend TPA.  Patient is improving.  NIH score is 0.  He feels he may have had an episode of vertigo but the slurred speech is concerning for possible infarct.  Does recommend admission for stroke work-up.  With patient's ongoing flank pain and near syncope episode, aortic aneurysm was ruled out by CT scan.  No aneurysm or dissection. UA Pending.  Patient speech is improving.  His drug screen is positive for cocaine and marijuana.  Not a candidate for TPA or endovascular intervention per neurology given his low NIH scale improving symptoms.  Admission for stroke work-up recommended for MRI and  echocardiogram. Discussed with Dr. Loney Loh.     Final Clinical Impression(s) / ED Diagnoses Final diagnoses:  None    Rx / DC Orders ED Discharge Orders    None       Haiden Rawlinson, Jeannett Senior, MD 08/15/19 [id number]

## 2019-08-15 NOTE — Progress Notes (Signed)
  Echocardiogram 2D Echocardiogram with bubble study has been performed.  Janalyn Harder 08/15/2019, 3:44 PM

## 2019-08-15 NOTE — Consult Note (Signed)
[facility] Tele[facility]urology Consult Services   Date of Service:   08/15/2019 05:30:26  Impression:     .  R42 - Dizziness/ Vertigo/ Giddiness  Comments/Sign-Out: 81 M, no pertinent PMH, here with new onset dizziness, gait instability and slurred speech triggered when he bent down. Now back to his nl self. NIHSS 0. Head CT, CTA head/neck all neg for acute abnl. He can be adm for formal stroke rule out with MRI.   PLAN  - MRI brain w/o contrast  - TTE w/bubble  - check a1c and LDL  - monitor on tele for afib  - neuro to follow  --  Metrics: Last Known Well: 08/15/2019 02:30:00 [facility] Notification Time: 08/15/2019 05:30:26 Arrival Time: 08/15/2019 05:45:00 Stamp Time: 08/15/2019 05:30:26 Time First Login Attempt: 08/15/2019 05:36:03 Symptoms: dizziness NIHSS Start Assessment Time: 08/15/2019 05:38:04 Patient is not a candidate for Alteplase/Activase. Alteplase Medical Decision: 08/15/2019 05:45:10 Patient was not deemed candidate for Alteplase/Activase thrombolytics because of Resolved symptoms (no residual disabling symptoms).  CT head showed no acute hemorrhage or acute core infarct.  Clinical Presentation is not Suggestive of Large Vessel Occlusive Disease  Sign Out:     .  Discussed with Emergency Department Provider  ------------------------------------------------------------------------------  History of Present Illness: Patient is a 49 year old Male.  Patient was brought by private transportation with symptoms of dizziness  48 M, no relevant PMH, who was LKW at 2:30 AM this morning when he woke up to take his dogs out. He felt fine initially on waking. After he let them back in, he bent over and then developed sudden onset dizziness, gait instability and some associated slurred speech. Sx persisted somewhat so he came to ER for eval. By his arrival he is back to his nl self. NIHSS 0. No history of stroke in the  past.   Examination: 1A: Level of Consciousness - Alert; keenly responsive + 0 1B: Ask Month and Age - Both Questions Right + 0 1C: Blink Eyes & Squeeze Hands - Performs Both Tasks + 0 2: Test Horizontal Extraocular Movements - Normal + 0 3: Test Visual Fields - No Visual Loss + 0 4: Test Facial Palsy (Use Grimace if Obtunded) - Normal symmetry + 0 5A: Test Left Arm Motor Drift - No Drift for 10 Seconds + 0 5B: Test Right Arm Motor Drift - No Drift for 10 Seconds + 0 6A: Test Left Leg Motor Drift - No Drift for 5 Seconds + 0 6B: Test Right Leg Motor Drift - No Drift for 5 Seconds + 0 7: Test Limb Ataxia (FNF/Heel-Shin) - No Ataxia + 0 8: Test Sensation - Normal; No sensory loss + 0 9: Test Language/Aphasia - Normal; No aphasia + 0 10: Test Dysarthria - Normal + 0 11: Test Extinction/Inattention - No abnormality + 0  NIHSS Score: 0  Pre-Morbid Modified Ranking Scale: 0 Points = No symptoms at all   Patient/Family was informed the Neurology Consult would occur via TeleHealth consult by way of interactive audio and video telecommunications and consented to receiving care in this manner.   Due to the immediate potential for life-threatening deterioration due to underlying acute neurologic illness, I spent 22 minutes providing critical care. This time includes time for face to face visit via telemedicine, review of medical records, imaging studies and discussion of findings with providers, the patient and/or family.   Dr Othelia Pulling   [facility] [phone]  Case [id number]

## 2019-08-16 DIAGNOSIS — R2990 Unspecified symptoms and signs involving the nervous system: Secondary | ICD-10-CM | POA: Diagnosis not present

## 2019-08-16 MED ORDER — ATORVASTATIN CALCIUM 40 MG PO TABS
80.0000 mg | ORAL_TABLET | Freq: Every day | ORAL | Status: DC
  Administered 2019-08-16: 11:00:00 80 mg via ORAL
  Filled 2019-08-16: qty 2

## 2019-08-16 MED ORDER — ASPIRIN 81 MG PO TBEC: 81.0000 mg | DELAYED_RELEASE_TABLET | Freq: Every day | ORAL | 0 refills | Status: AC

## 2019-08-16 MED ORDER — ATORVASTATIN CALCIUM 80 MG PO TABS: 80.0000 mg | ORAL_TABLET | Freq: Every day | ORAL | 0 refills | Status: AC

## 2019-08-16 MED ORDER — POTASSIUM CHLORIDE CRYS ER 20 MEQ PO TBCR
40.0000 meq | EXTENDED_RELEASE_TABLET | Freq: Once | ORAL | Status: AC
  Administered 2019-08-16: 40 meq via ORAL
  Filled 2019-08-16: qty 2

## 2019-08-16 MED ORDER — ASPIRIN EC 81 MG PO TBEC
81.0000 mg | DELAYED_RELEASE_TABLET | Freq: Every day | ORAL | Status: DC
  Administered 2019-08-16: 81 mg via ORAL
  Filled 2019-08-16: qty 1

## 2019-08-16 NOTE — Discharge Instructions (Addendum)
Abdominal Pain, Adult Many things can cause belly (abdominal) pain. Most times, belly pain is not dangerous. Many cases of belly pain can be watched and treated at home. Sometimes, though, belly pain is serious. Your doctor will try to find the cause of your belly pain. Follow these instructions at home:  Medicines  Take over-the-counter and prescription medicines only as told by your doctor.  Do not take medicines that help you poop (laxatives) unless told by your doctor. General instructions  Watch your belly pain for any changes.  Drink enough fluid to keep your pee (urine) pale yellow.  Keep all follow-up visits as told by your doctor. This is important. Contact a doctor if:  Your belly pain changes or gets worse.  You are not hungry, or you lose weight without trying.  You are having trouble pooping (constipated) or have watery poop (diarrhea) for more than 2-3 days.  You have pain when you pee or poop.  Your belly pain wakes you up at night.  Your pain gets worse with meals, after eating, or with certain foods.  You are vomiting and cannot keep anything down.  You have a fever.  You have blood in your pee. Get help right away if:  Your pain does not go away as soon as your doctor says it should.  You cannot stop vomiting.  Your pain is only in areas of your belly, such as the right side or the left lower part of the belly.  You have bloody or black poop, or poop that looks like tar.  You have very bad pain, cramping, or bloating in your belly.  You have signs of not having enough fluid or water in your body (dehydration), such as: ? Dark pee, very little pee, or no pee. ? Cracked lips. ? Dry mouth. ? Sunken eyes. ? Sleepiness. ? Weakness.  You have trouble breathing or chest pain. Summary  Many cases of belly pain can be watched and treated at home.  Watch your belly pain for any changes.  Take over-the-counter and prescription medicines only as  told by your doctor.  Contact a doctor if your belly pain changes or gets worse.  Get help right away if you have very bad pain, cramping, or bloating in your belly. This information is not intended to replace advice given to you by your health care provider. Make sure you discuss any questions you have with your health care provider. Document Revised: 09/13/2018 Document Reviewed: 09/13/2018 Elsevier Patient Education  2020 Elsevier Inc.   Flank Pain, Adult Flank pain is pain in your side. The flank is the area of your side between your upper belly (abdomen) and your back. The pain may occur over a short time (acute), or it may be long-term or come back often (chronic). It may be mild or very bad. Pain in this area can be caused by many different things. Follow these instructions at home:   Drink enough fluid to keep your pee (urine) clear or pale yellow.  Rest as told by your doctor.  Take over-the-counter and prescription medicines only as told by your doctor.  Keep a journal to keep track of: ? What has caused your flank pain. ? What has made it feel better.  Keep all follow-up visits as told by your doctor. This is important. Contact a doctor if:  Medicine does not help your pain.  You have new symptoms.  Your pain gets worse.  You have a fever.  Your  symptoms last longer than 2-3 days.  You have trouble peeing.  You are peeing more often than normal. Get help right away if:  You have trouble breathing.  You are short of breath.  Your belly hurts, or it is swollen or red.  You feel sick to your stomach (nauseous).  You throw up (vomit).  You feel like you will pass out, or you do pass out (faint).  You have blood in your pee. Summary  Flank pain is pain in your side. The flank is the area of your side between your upper belly (abdomen) and your back.  Flank pain may occur over a short time (acute), or it may be long-term or come back often (chronic).  It may be mild or very bad.  Pain in this area can be caused by many different things.  Contact your doctor if your symptoms get worse or they last longer than 2-3 days. This information is not intended to replace advice given to you by your health care provider. Make sure you discuss any questions you have with your health care provider. Document Revised: 04/17/2017 Document Reviewed: 08/25/2016 Elsevier Patient Education  2020 Elsevier Inc.   Abdominal Pain, Adult Pain in the abdomen (abdominal pain) can be caused by many things. Often, abdominal pain is not serious and it gets better with no treatment or by being treated at home. However, sometimes abdominal pain is serious. Your health care provider will ask questions about your medical history and do a physical exam to try to determine the cause of your abdominal pain. Follow these instructions at home:  Medicines  Take over-the-counter and prescription medicines only as told by your health care provider.  Do not take a laxative unless told by your health care provider. General instructions  Watch your condition for any changes.  Drink enough fluid to keep your urine pale yellow.  Keep all follow-up visits as told by your health care provider. This is important. Contact a health care provider if:  Your abdominal pain changes or gets worse.  You are not hungry or you lose weight without trying.  You are constipated or have diarrhea for more than 2-3 days.  You have pain when you urinate or have a bowel movement.  Your abdominal pain wakes you up at night.  Your pain gets worse with meals, after eating, or with certain foods.  You are vomiting and cannot keep anything down.  You have a fever.  You have blood in your urine. Get help right away if:  Your pain does not go away as soon as your health care provider told you to expect.  You cannot stop vomiting.  Your pain is only in areas of the abdomen, such as the  right side or the left lower portion of the abdomen. Pain on the right side could be caused by appendicitis.  You have bloody or black stools, or stools that look like tar.  You have severe pain, cramping, or bloating in your abdomen.  You have signs of dehydration, such as: ? Dark urine, very little urine, or no urine. ? Cracked lips. ? Dry mouth. ? Sunken eyes. ? Sleepiness. ? Weakness.  You have trouble breathing or chest pain. Summary  Often, abdominal pain is not serious and it gets better with no treatment or by being treated at home. However, sometimes abdominal pain is serious.  Watch your condition for any changes.  Take over-the-counter and prescription medicines only as told by your health  care provider.  Contact a health care provider if your abdominal pain changes or gets worse.  Get help right away if you have severe pain, cramping, or bloating in your abdomen. This information is not intended to replace advice given to you by your health care provider. Make sure you discuss any questions you have with your health care provider. Document Revised: 09/13/2018 Document Reviewed: 09/13/2018 Elsevier Patient Education  2020 Elsevier Inc.   Flank Pain, Adult Flank pain is pain that is located on the side of the body between the upper abdomen and the back. This area is called the flank. The pain may occur over a short period of time (acute), or it may be long-term or recurring (chronic). It may be mild or severe. Flank pain can be caused by many things, including:  Muscle soreness or injury.  Kidney stones or kidney disease.  Stress.  A disease of the spine (vertebral disk disease).  A lung infection (pneumonia).  Fluid around the lungs (pulmonary edema).  A skin rash caused by the chickenpox virus (shingles).  Tumors that affect the back of the abdomen.  Gallbladder disease. Follow these instructions at home:   Drink enough fluid to keep your urine clear  or pale yellow.  Rest as told by your health care provider.  Take over-the-counter and prescription medicines only as told by your health care provider.  Keep a journal to track what has caused your flank pain and what has made it feel better.  Keep all follow-up visits as told by your health care provider. This is important. Contact a health care provider if:  Your pain is not controlled with medicine.  You have new symptoms.  Your pain gets worse.  You have a fever.  Your symptoms last longer than 2-3 days.  You have trouble urinating or you are urinating very frequently. Get help right away if:  You have trouble breathing or you are short of breath.  Your abdomen hurts or it is swollen or red.  You have nausea or vomiting.  You feel faint or you pass out.  You have blood in your urine. Summary  Flank pain is pain that is located on the side of the body between the upper abdomen and the back.  The pain may occur over a short period of time (acute), or it may be long-term or recurring (chronic). It may be mild or severe.  Flank pain can be caused by many things.  Contact your health care provider if your symptoms get worse or they last longer than 2-3 days. This information is not intended to replace advice given to you by your health care provider. Make sure you discuss any questions you have with your health care provider. Document Revised: 04/17/2017 Document Reviewed: 07/18/2016 Elsevier Patient Education  2020 Elsevier Inc. Stimulant Use Disorder-Cocaine Cocaine belongs to a group of powerful drugs known as stimulants. Common street names for cocaine include coke, crack, blow, snow, C, powder, and nose candy. Cocaine has some medical uses, but it is often misused because of the effects that it produces. These effects include:  A feeling of extreme pleasure (euphoria).  Alertness.  A high energy level. Stimulant use disorder is when your stimulant use  disrupts your daily life. It may disrupt your relationships and how you do your job. Stimulant use disorder can be dangerous. Cocaine increases your blood pressure and heart rate. Using it can lead to a heart attack or stroke. Cocaine can also make your heart  rate irregular and cause seizures. These problems can lead to death. What are the causes? This condition is caused by misusing cocaine. Many people start using cocaine because it makes them feel good. Over time, they get addicted to it. When they try to stop using it, they feel sick. What increases the risk? This condition is more likely to develop in:  People who misuse other drugs.  People with a family history of misusing drugs. What are the signs or symptoms? Symptoms of this condition include:  Using greater amounts of cocaine than you want to, or using cocaine for longer than you want to.  Trying several times to use less cocaine or to control your cocaine use.  Craving cocaine.  Spending a lot of time getting cocaine, using it, or recovering from its effects.  Having problems at work, at school, at home, or with relationships because of cocaine use.  Giving up or cutting down on important life activities because of cocaine use.  Using cocaine when it is dangerous, such as when driving a car.  Continuing to use cocaine even though it is causing or has led to a physical problem, such as: ? Malnutrition. ? Nosebleeds. ? Chest pain. ? High blood pressure. ? A hole between the part of your nose that separates your nostrils (perforated nasal septum). ? Lung and kidney damage.  Continuing to use cocaine even though it is causing a mental problem, such as: ? Schizophrenia-like symptoms. ? Depression. ? Bipolar mood swings. ? Anxiety. ? Sleep problems.  Needing more and more cocaine to get the same effect that you want (building up a tolerance).  Having symptoms of withdrawal when you stop using cocaine. Symptoms of  withdrawal include: ? Depression. ? Irritability. ? Low energy. ? Restlessness. ? Bad dreams. ? Too little or too much sleep. ? Increased appetite. How is this diagnosed? This condition is diagnosed with an assessment. During the assessment, your health care provider will ask about your cocaine use and about how it affects your life. Your health care provider may also:  Perform a physical exam or do lab tests to see if you have physical problems resulting from cocaine use.  Screen for drug use.  Refer you to a mental health professional for evaluation. How is this treated? Treatment for this condition is usually provided by mental health professionals with training in substance use disorders. Treatment may involve:  Counseling. This treatment is also called talk therapy. It is provided by substance use treatment counselors. A counselor can address the reasons you use cocaine and suggest ways to keep you from using it again. The goals of talk therapy are to: ? Find healthy activities to replace using cocaine. ? Identify and avoid what triggers your cocaine use. ? Help you learn how to handle cravings.  Support groups. Support groups are run by people who have quit using stimulants. They provide emotional support, advice, and guidance.  Medicines. Follow these instructions at home:  Take over-the-counter and prescription medicines only as told by your health care provider.  Check with your health care provider before starting any new medicines.  Do not use any products that contain nicotine or tobacco, such as cigarettes and e-cigarettes. If you need help quitting, ask your health care provider.  Keep all follow-up visits as told by your health care provider. This is important. Where to find more information  [employer] on Drug Abuse: http://www.price-smith.com/  Substance Abuse and Mental Health Services Administration: SkateOasis.com.pt Contact a health  care provider if:  You  are not able to take your medicines as told.  You use cocaine again.  Your symptoms get worse. Get help right away if:  You have serious thoughts about hurting yourself or others.  You have a seizure.  You have chest pain.  You have sudden weakness.  You lose some of your vision.  You lose some of your speech. If you ever feel like you may hurt yourself or others, or have thoughts about taking your own life, get help right away. You can go to your nearest emergency department or call:  Your local emergency services (911 in the [place]  A suicide crisis helpline, such as the National Suicide Prevention Lifeline at 216-847-7496. This is open 24 hours a day. This information is not intended to replace advice given to you by your health care provider. Make sure you discuss any questions you have with your health care provider. Document Revised: 04/17/2017 Document Reviewed: 02/15/2016 Elsevier Patient Education  2020 [employer]. Finding Treatment for Addiction Addiction is a complex disease of the brain that causes an uncontrollable (compulsive) need for:  A substance. This includes alcohol, illegal drugs, or prescription medicines, such as painkillers.  An activity or behavior, such as gambling or shopping. Addiction changes the way your brain works. Because of this change:  The need for the medicine, drug, or activity can become so strong that you think about it all the time.  Getting more and more of your addiction becomes the most important thing to you.  You may find yourself leaving other activities and relationships to pursue your addiction.  You can become physically dependent on a substance.  Your health, behavior, emotions, and relationships can change for the worse. How do I know if I need treatment for addiction? Addiction is a progressive disease. Without treatment, addiction can get worse. Living with addiction puts you at higher risk for injury, poor health,  loss of employment, loss of money, and even death. You might need treatment for addiction if:  You have tried to stop or cut down, but you have not succeeded.  You find it annoying that your friends and family are concerned about your use or behavior.  You feel guilty about your use or behavior.  You need a particular substance or activity to start your day or to calm down.  You are running out of money because of your addiction.  You have done something illegal to support your addiction.  Your addiction has caused you: ? Health problems. ? Trouble in school, work, home, or with the police. ? To devote all your time to your addiction, and not to other responsibilities. ? To tell lies in order to hide your problem. What types of treatment are available? There may be options for treatment programs and plans based on your addiction, condition, needs, and preferences. No single treatment is right for everyone.  Treatment programs can be: ? Outpatient. You live at home and go to work or school, but you go to a clinic for treatment. ? Inpatient. You live and sleep at the program facility during treatment.  Programs may include: ? Medicine. You may need medicine to treat the addiction itself, or to treat anxiety or depression. ? Counseling and behavior therapy. This can help individuals and families behave in healthier ways and relate more effectively. ? Support groups. Confidential group therapy, such as a 12-step program, can help individuals and families during treatment and recovery. ? A combination of  education, counseling, and a 12-step, spirituality-based approach. What should I consider when selecting a treatment program? Think about your individual requirements when selecting a treatment program. Ask about:  The overall approach to treatment. ? Some programs are strictly 12-step programs. Some have a more flexible approach. ? Programs may differ in length of stay, setting, and  size. ? Some programs include your family in your treatment plan. Support may be offered to them throughout the treatment process, as well as instructions for them when you are discharged. ? You may continue to receive support after you have left the program.  The types of medical services that are offered. Find out if the program: ? Offers specific treatment for your particular addiction. ? Meets all of your needs, including physical and cultural needs. ? Includes any medicines you might need. ? Offers mental health counseling as part of your treatment. ? Offers the 12-step meetings at the center, or if transport is available for patients to attend meetings at other locations.  The cost and types of insurance that are accepted. ? Some programs are sponsored by the government. They support patients who do not have private insurance. ? If you do not have insurance, or if you choose to attend a program that does not accept your insurance, call the treatment center. Tell them your financial needs and whether a payment plan can be set up. ? There are also organizations that will help you find the resources for treatment. You can find them online by searching "treatment for addiction."  If the program is certified by the appropriate government agency. Where to find support  Your health care provider can help you to find the right treatment. These discussions are confidential.  The [employer] on Alcoholism and Drug Dependence (NCADD). This group has information about treatment centers and programs for people who have an addiction and for family members. ? Call: 1-800-NCA-CALL ((601)474-2665). ? Visit the website: https://www.ncadd.org/  The Substance Abuse and Mental Health Services Administration [facility]). This organization will help you find publicly funded treatment centers, help hotlines, and counseling services near you. ? Call: 1-800-662-HELP ((352)236-7038). ? Visit the website:  www.findtreatment.RockToxic.pl  The National Problem Gambling Helpline. This is a 24-hour confidential helpline for gambling addiction. ? Call: 2367567400 ? Visit the website: CocoaInvestor.tn In countries outside of the [place] and [place], look in [employer] for contact information for services in your area. Follow these instructions at home:  Find supportive people who will help you stay away from your addiction and stay sober.  Do not use the substance or engage in the activity.  If you have been through treatment: ? Follow your plan. The plan is usually developed by you and your health care provider during treatment. ? Go to meetings with other people in recovery. ? Avoid people, situations, and things that lead you to do the things you are addicted to (triggers). Summary  Addiction changes the way your brain works. These changes cause a desire to repeat and increase the use of the a substance or behavior.  Addiction is a progressive disease. Without treatment, addiction can get worse. Living with addiction puts you at higher risk for injury, poor health, loss of employment, loss of money, and even death.  There may be options for treatment programs and plans based on your addiction, condition, needs, and preferences. No single treatment is right for everyone.  Your health care provider can help you to find the right treatment. These discussions are confidential. This information  is not intended to replace advice given to you by your health care provider. Make sure you discuss any questions you have with your health care provider. Document Revised: 01/26/2019 Document Reviewed: 06/03/2017 Elsevier Patient Education  2020 Elsevier Inc. Tobacco Use Disorder Tobacco use disorder (TUD) occurs when a person craves, seeks, and uses tobacco, regardless of the consequences. This disorder can cause problems with mental and physical health. It can affect your ability to have  healthy relationships, and it can keep you from meeting your responsibilities at work, home, or school. Tobacco may be:  Smoked as a cigarette or cigar.  Inhaled using e-cigarettes.  Smoked in a pipe or hookah.  Chewed as smokeless tobacco.  Inhaled into the nostrils as snuff. Tobacco products contain a dangerous chemical called nicotine, which is very addictive. Nicotine triggers hormones that make the body feel stimulated and works on areas of the brain that make you feel good. These effects can make it hard for people to quit nicotine. Tobacco contains many other unsafe chemicals that can damage almost every organ in the body. Smoking tobacco also puts others in danger due to fire risk and possible health problems caused by breathing in secondhand smoke. What are the signs or symptoms? Symptoms of TUD may include:  Being unable to slow down or stop your tobacco use.  Spending an abnormal amount of time getting or using tobacco.  Craving tobacco. Cravings may last for up to 6 months after quitting.  Tobacco use that: ? Interferes with your work, school, or home life. ? Interferes with your personal and social relationships. ? Makes you give up activities that you once enjoyed or found important.  Using tobacco even though you know that it is: ? Dangerous or bad for your health or someone else's health. ? Causing problems in your life.  Needing more and more of the substance to get the same effect (developing tolerance).  Experiencing unpleasant symptoms if you do not use the substance (withdrawal). Withdrawal symptoms may include: ? Depressed, anxious, or irritable mood. ? Difficulty concentrating. ? Increased appetite. ? Restlessness or trouble sleeping.  Using the substance to avoid withdrawal. How is this diagnosed? This condition may be diagnosed based on:  Your current and past tobacco use. Your health care provider may ask questions about how your tobacco use  affects your life.  A physical exam. You may be diagnosed with TUD if you have at least two symptoms within a 34-month period. How is this treated? This condition is treated by stopping tobacco use. Many people are unable to quit on their own and need help. Treatment may include:  Nicotine replacement therapy (NRT). NRT provides nicotine without the other harmful chemicals in tobacco. NRT gradually lowers the dosage of nicotine in the body and reduces withdrawal symptoms. NRT is available as: ? Over-the-counter gums, lozenges, and skin patches. ? Prescription mouth inhalers and nasal sprays.  Medicine that acts on the brain to reduce cravings and withdrawal symptoms.  A type of talk therapy that examines your triggers for tobacco use, how to avoid them, and how to cope with cravings (behavioral therapy).  Hypnosis. This may help with withdrawal symptoms.  Joining a support group for others coping with TUD. The best treatment for TUD is usually a combination of medicine, talk therapy, and support groups. Recovery can be a long process. Many people start using tobacco again after stopping (relapse). If you relapse, it does not mean that treatment will not work. Follow these instructions  at home:  Lifestyle  Do not use any products that contain nicotine or tobacco, such as cigarettes and e-cigarettes.  Avoid things that trigger tobacco use as much as you can. Triggers include people and situations that usually cause you to use tobacco.  Avoid drinks that contain caffeine, including coffee. These may worsen some withdrawal symptoms.  Find ways to manage stress. Wanting to smoke may cause stress, and stress can make you want to smoke. Relaxation techniques such as deep breathing, meditation, and yoga may help.  Attend support groups as needed. These groups are an important part of long-term recovery for many people. General instructions  Take over-the-counter and prescription medicines  only as told by your health care provider.  Check with your health care provider before taking any new prescription or over-the-counter medicines.  Decide on a friend, family member, or smoking quit-line (such as 1-800-QUIT-NOW in the [place] that you can call or text when you feel the urge to smoke or when you need help coping with cravings.  Keep all follow-up visits as told by your health care provider and therapist. This is important. Contact a health care provider if:  You are not able to take your medicines as prescribed.  Your symptoms get worse, even with treatment. Summary  Tobacco use disorder (TUD) occurs when a person craves, seeks, and uses tobacco regardless of the consequences.  This condition may be diagnosed based on your current and past tobacco use and a physical exam.  Many people are unable to quit on their own and need help. Recovery can be a long process.  The most effective treatment for TUD is usually a combination of medicine, talk therapy, and support groups. This information is not intended to replace advice given to you by your health care provider. Make sure you discuss any questions you have with your health care provider. Document Revised: 04/22/2017 Document Reviewed: 04/22/2017 Elsevier Patient Education  2020 [employer] Transient Ischemic Attack  A transient ischemic attack (TIA) is a "warning stroke" that causes stroke-like symptoms that go away quickly. A TIA does not cause lasting damage to the brain. But having a TIA is a sign that you may be at risk for a stroke. Lifestyle changes and medical treatments can help prevent a stroke. It is important to know the symptoms of a TIA and what to do. Get help right away, even if your symptoms go away. The symptoms of a TIA are the same as those of a stroke. They can happen fast, and they usually go away within minutes or hours. They can include:  Weakness or loss of feeling in your face, arm, or leg. This  often happens on one side of your body.  Trouble walking.  Trouble moving your arms or legs.  Trouble talking or understanding what people are saying.  Trouble seeing.  Seeing two of one object (double vision).  Feeling dizzy.  Feeling confused.  Loss of balance or coordination.  Feeling sick to your stomach (nauseous) and throwing up (vomiting).  A very bad headache for no reason. What increases the risk? Certain things may make you more likely to have a TIA. Some of these are things that you can change, such as:  Being very overweight (obese).  Using products that contain nicotine or tobacco, such as cigarettes and e-cigarettes.  Taking birth control pills.  Not being active.  Drinking too much alcohol.  Using drugs. Other risk factors include:  Having an irregular heartbeat (atrial fibrillation).  Being African American or Hispanic.  Having had blood clots, stroke, TIA, or heart attack in the past.  Being a woman with a history of high blood pressure in pregnancy (preeclampsia).  Being over the age of 44.  Being male.  Having family history of stroke.  Having the following diseases or conditions: ? High blood pressure. ? High cholesterol. ? Diabetes. ? Heart disease. ? Sickle cell disease. ? Sleep apnea. ? Migraine headache. ? Long-term (chronic) diseases that cause soreness and swelling (inflammation). ? Disorders that affect how your blood clots. Follow these instructions at home: Medicines   Take over-the-counter and prescription medicines only as told by your doctor.  If you were told to take aspirin or another medicine to thin your blood, take it exactly as told by your doctor. ? Taking too much of the medicine can cause bleeding. ? Taking too little of the medicine may not work to treat the problem. Eating and drinking   Eat 5 or more servings of fruits and vegetables each day.  Follow instructions from your doctor about your diet.  You may need to follow a certain diet to help lower your risk of having a stroke. You may need to: ? Eat a diet that is low in fat and salt. ? Eat foods that contain a lot of fiber. ? Limit the amount of carbohydrates and sugar in your diet.  Limit alcohol intake to 1 drink a day for nonpregnant women and 2 drinks a day for men. One drink equals 12 oz of beer, 5 oz of wine, or 1 oz of hard liquor. General instructions  Keep a healthy weight.  Stay active. Try to get at least 30 minutes of activity on all or most days.  Find out if you have a condition called sleep apnea. Get treatment if needed.  Do not use any products that contain nicotine or tobacco, such as cigarettes and e-cigarettes. If you need help quitting, ask your doctor.  Do not abuse drugs.  Keep all follow-up visits as told by your doctor. This is important. Get help right away if:  You have any signs of stroke. "BE FAST" is an easy way to remember the main warning signs: ? B - Balance. Signs are dizziness, sudden trouble walking, or loss of balance. ? E - Eyes. Signs are trouble seeing or a sudden change in how you see. ? F - Face. Signs are sudden weakness or loss of feeling of the face, or the face or eyelid drooping on one side. ? A - Arms. Signs are weakness or loss of feeling in an arm. This happens suddenly and usually on one side of the body. ? S - Speech. Signs are sudden trouble speaking, slurred speech, or trouble understanding what people say. ? T - Time. Time to call emergency services. Write down what time symptoms started.  You have other signs of stroke, such as: ? A sudden, very bad headache with no known cause. ? Feeling sick to your stomach (nausea). ? Throwing up (vomiting). ? Jerky movements that you cannot control (seizure). These symptoms may be an emergency. Do not wait to see if the symptoms will go away. Get medical help right away. Call your local emergency services (911 in the [place] Do not  drive yourself to the hospital. Summary  A transient ischemic attack (TIA) is a "warning stroke" that causes stroke-like symptoms that go away quickly.  A TIA is a medical emergency. Get help right away, even if  your symptoms go away.  A TIA does not cause lasting damage to the brain.  Having a TIA is a sign that you may be at risk for a stroke. Lifestyle changes and medical treatments can help prevent a stroke. This information is not intended to replace advice given to you by your health care provider. Make sure you discuss any questions you have with your health care provider. Document Revised: 01/29/2018 Document Reviewed: 08/06/2016 Elsevier Patient Education  [place].

## 2019-08-16 NOTE — Discharge Summary (Addendum)
Discharge Summary  Justin Hardy WUJ:[id number] DOB: Dec 09, 1970  PCP: Patient, No Pcp Per  Admit date: 08/15/2019 Discharge date: 08/16/2019  Time spent: 35 minutes   Recommendations for Outpatient Follow-up:  1. Please follow-up with neurology in 1 to 2 weeks. 2. Follow-up with your primary care provider. 3. Please abstain from tobacco and cocaine use  4. Take your medications as prescribed  Discharge Diagnoses:  Active Hospital Problems   Diagnosis Date Noted  . Neurological symptoms 08/15/2019  . Polysubstance abuse (HCC) 08/15/2019  . Alcohol dependence, binge pattern (HCC) 08/15/2019  . Tobacco abuse 08/15/2019  . Left flank pain 08/15/2019  . Asthma   . Dizziness   . Dysarthria   . Bradycardia     Resolved Hospital Problems  No resolved problems to display.    Discharge Condition: Stable  Diet recommendation: Resume previous diet  Vitals:   08/15/19 2147 08/16/19 0518  BP: 102/73 103/80  Pulse: (!) 56 (!) 48  Resp: 16 18  Temp: 98.1 F (36.7 C) 97.6 F (36.4 C)  SpO2: 95% 96%    History of present illness:  Justin Hardy is a 49 y.o. male with medical history significant for polysubstance abuse including alcohol and tobacco use who was in his usual state of health and feeling okay when he was tending to his dog all of a sudden he stood up and felt very lightheaded.  He sat down hoping that it would improve his symptoms, did not help.  He tried to tell his wife and found it difficult to communicate.  He knew what he wanted to say, but could not get the words out.  His wife became concerned and drove him to the emergency room.  En route, he felt like he had difficulty with motor skills was not able to change the temperature in the car.  His wife had to do this for him.  ED Course: In the emergency room, patient underwent a code stroke.  CT scan of the head was unremarkable.  It was noted on arrival, but his blood pressure was stable with a BP of 137/96 and  pulse of 91 initially but since then has come down and at times bradycardic.  Patient's stated that by the time he waited to the emergency room and was talked to them ER doctor, he said he felt almost completely back to normal in the time he got to the floor, he feels 100% back to normal.  Patent seen by neurology and underwent a CT angiogram of the head and neck which was unremarkable.  Neurology recommended full stroke work-up.  Patient's urine drug screen positive for THC and cocaine.   On an unrelated issue, patient tells me that for the last month, his left lower quadrant has been giving him some discomfort.  It is intermittent, described as a dull ache and ranges from mild 3 out of 10 to 7/10.  Does not go anywhere else, although at times he can feel it also in his left flank.  Nothing makes it better or worse.  Does not appear to be positional.  Does not improve or get worse whenever he urinates or moves his bowels.  Does not affect his eating habits.  08/16/19: Seen and examined.  Back to his baseline.  No significant pain in his left flank.  No other complaints.  Eager to go home.  Advised on polysubstance abuse cessation and to take his medications as prescribed.  Declined nicotine patch and would like to quit on  his own for now.  Vital signs and labs reviewed and are stable.  Patient will need to follow-up with neurology post hospitalization.    Hospital Course:  Principal Problem:   Neurological symptoms Active Problems:   Asthma   Dizziness   Dysarthria   Bradycardia   Polysubstance abuse (HCC)   Alcohol dependence, binge pattern (HCC)   Tobacco abuse   Left flank pain   TIA suspect contributed by vasospasm in the setting of cocaine use CTA head and neck unremarkable MRI brain unremarkable for any acute intracranial findings 2D echo no evidence of thrombus or PFO Twelve-lead EKG sinus rhythm A1c 5.3, goal less than 7.0 LDL 129 with goal less than 70 Polysubstance abuse  counseling including tobacco and cocaine cessation counseling Started aspirin 81 mg daily and Lipitor 80 mg daily for secondary TIA/CVA prevention. Follow-up with neurology in 1 to 2 weeks.  Hyperlipidemia Start Lipitor 80 mg daily Repeat lipid panel in 3 months Follow-up with neurology or your PCP  Tobacco use disorder Recommend complete tobacco cessation Cessation counseling done  Hypokalemia Potassium 3.4 Repleted with oral KCl Follow-up with your PCP Repeat chemistry panel in 1 week.  Left flank pain Reports no significant pain at the time of this evaluation CTA abd/pelvis revealed no obstructing kidney stones, no hydronephrosis Suspect possible calculi, but since he had excreting contrast related to prior neck CTA, this could obscure small calculi per radiology report. Exam benign- states has played golf, possible muscle spasm also on the differential. Recommend ice pack or tylenol PRN if recurs or follow up with PCP Avoid dehydration. UA negative for pyuria. Afebrile with no leukocytosis.    Discharge Exam: BP 103/80 (BP Location: Right Arm)   Pulse (!) 48   Temp 97.6 F (36.4 C) (Oral)   Resp 18   Ht 6' (1.829 m)   Wt 78.2 kg   SpO2 96%   BMI 23.40 kg/m  . General: 49 y.o. year-old male well developed well nourished in no acute distress.  Alert and oriented x3. . Cardiovascular: Regular rate and rhythm with no rubs or gallops.  No thyromegaly or JVD noted.   Marland Kitchen Respiratory: Clear to auscultation with no wheezes or rales. Good inspiratory effort. . Abdomen: Soft nontender nondistended with normal bowel sounds x4 quadrants. . Musculoskeletal: No lower extremity edema. 2/4 pulses in all 4 extremities. . Skin: No ulcerative lesions noted or rashes, . Psychiatry: Mood is appropriate for condition and setting  Discharge Instructions You were cared for by a hospitalist during your hospital stay. If you have any questions about your discharge medications or the care  you received while you were in the hospital after you are discharged, you can call the unit and asked to speak with the hospitalist on call if the hospitalist that took care of you is not available. Once you are discharged, your primary care physician will handle any further medical issues. Please note that NO REFILLS for any discharge medications will be authorized once you are discharged, as it is imperative that you return to your primary care physician (or establish a relationship with a primary care physician if you do not have one) for your aftercare needs so that they can reassess your need for medications and monitor your lab values.   Allergies as of 08/16/2019   No Known Allergies     Medication List    STOP taking these medications   predniSONE 20 MG tablet Commonly known as: DELTASONE     TAKE  these medications   albuterol 108 (90 Base) MCG/ACT inhaler Commonly known as: VENTOLIN HFA Inhale 1-2 puffs into the lungs every 6 (six) hours as needed for wheezing.   aspirin 81 MG EC tablet Take 1 tablet (81 mg total) by mouth daily.   atorvastatin 80 MG tablet Commonly known as: LIPITOR Take 1 tablet (80 mg total) by mouth daily at 6 PM.   ibuprofen 200 MG tablet Commonly known as: ADVIL Take 400 mg by mouth every 6 (six) hours as needed for headache or moderate pain.      No Known Allergies Follow-up Information    Micki Riley, MD. Call in 1 day(s).   Specialties: Neurology, Radiology Why: Please call for a post hospital follow-up appointment. Contact information: [address] [place] [address] [phone]            The results of significant diagnostics from this hospitalization (including imaging, microbiology, ancillary and laboratory) are listed below for reference.    Significant Diagnostic Studies: CT Angio Head W or Wo Contrast  Result Date: 08/15/2019 CLINICAL DATA:  Slurred speech with suspected neuro deficit. EXAM: CT  ANGIOGRAPHY HEAD AND NECK TECHNIQUE: Multidetector CT imaging of the head and neck was performed using the standard protocol during bolus administration of intravenous contrast. Multiplanar CT image reconstructions and MIPs were obtained to evaluate the vascular anatomy. Carotid stenosis measurements (when applicable) are obtained utilizing NASCET criteria, using the distal internal carotid diameter as the denominator. CONTRAST:  OMNIPAQUE IOHEXOL 350 MG/ML SOLN COMPARISON:  None. FINDINGS: CTA NECK FINDINGS Aortic arch: Normal.  Three vessel branching. Right carotid system: Vessels are smooth and widely patent. No atheromatous changes or beading. Left carotid system: Vessels are smooth and widely patent. There is distal ICA looping without beading. No noted atheromatous changes. Vertebral arteries: No proximal subclavian stenosis or atheromatous change. Essentially codominant vertebral arteries. Vessels are smooth and widely patent. Skeleton: Lower cervical degenerative disc narrowing and ridging. Other neck: Negative Upper chest: Reported separately Review of the MIP images confirms the above findings CTA HEAD FINDINGS Anterior circulation: No atheromatous changes, beading, branch occlusion, or aneurysm. Posterior circulation: The vertebral and basilar arteries are smooth and widely patent. No branch occlusion, beading, or aneurysm Venous sinuses: Diffusely patent Anatomic variants: None significant Review of the MIP images confirms the above findings IMPRESSION: Negative CTA of the head and neck. Electronically Signed   By: Marnee Spring M.D.   On: 08/15/2019 05:41   DG Chest 2 View  Result Date: 08/15/2019 CLINICAL DATA:  Shortness of breath EXAM: CHEST - 2 VIEW COMPARISON:  03/16/2011 FINDINGS: The heart size and mediastinal contours are within normal limits. Both lungs are clear. The visualized skeletal structures are unremarkable. IMPRESSION: No active cardiopulmonary disease. Electronically  Signed   By: Alcide Clever M.D.   On: 08/15/2019 03:21   CT Angio Neck W and/or Wo Contrast  Result Date: 08/15/2019 CLINICAL DATA:  Slurred speech with suspected neuro deficit. EXAM: CT ANGIOGRAPHY HEAD AND NECK TECHNIQUE: Multidetector CT imaging of the head and neck was performed using the standard protocol during bolus administration of intravenous contrast. Multiplanar CT image reconstructions and MIPs were obtained to evaluate the vascular anatomy. Carotid stenosis measurements (when applicable) are obtained utilizing NASCET criteria, using the distal internal carotid diameter as the denominator. CONTRAST:  OMNIPAQUE IOHEXOL 350 MG/ML SOLN COMPARISON:  None. FINDINGS: CTA NECK FINDINGS Aortic arch: Normal.  Three vessel branching. Right carotid system: Vessels are smooth and widely  patent. No atheromatous changes or beading. Left carotid system: Vessels are smooth and widely patent. There is distal ICA looping without beading. No noted atheromatous changes. Vertebral arteries: No proximal subclavian stenosis or atheromatous change. Essentially codominant vertebral arteries. Vessels are smooth and widely patent. Skeleton: Lower cervical degenerative disc narrowing and ridging. Other neck: Negative Upper chest: Reported separately Review of the MIP images confirms the above findings CTA HEAD FINDINGS Anterior circulation: No atheromatous changes, beading, branch occlusion, or aneurysm. Posterior circulation: The vertebral and basilar arteries are smooth and widely patent. No branch occlusion, beading, or aneurysm Venous sinuses: Diffusely patent Anatomic variants: None significant Review of the MIP images confirms the above findings IMPRESSION: Negative CTA of the head and neck. Electronically Signed   By: Marnee Spring M.D.   On: 08/15/2019 05:41   MR BRAIN WO CONTRAST  Result Date: 08/15/2019 CLINICAL DATA:  Difficulty speaking and dizziness, code stroke follow-up EXAM: MRI HEAD WITHOUT CONTRAST  TECHNIQUE: Multiplanar, multiecho pulse sequences of the brain and surrounding structures were obtained without intravenous contrast. COMPARISON:  Correlation made with prior CT imaging FINDINGS: Brain: There is no acute infarction or intracranial hemorrhage. There is no intracranial mass, mass effect, or edema. There is no hydrocephalus or extra-axial fluid collection. Vascular: Major vessel flow voids at the skull base are preserved. Skull and upper cervical spine: Normal marrow signal is preserved. Sinuses/Orbits: Paranasal sinuses are aerated. Orbits are unremarkable. Other: Sella is unremarkable.  Mastoid air cells are clear. IMPRESSION: No evidence of recent infarct, hemorrhage, or mass. Electronically Signed   By: Guadlupe Spanish M.D.   On: 08/15/2019 14:34   ECHOCARDIOGRAM COMPLETE BUBBLE STUDY  Result Date: 08/16/2019    ECHOCARDIOGRAM REPORT   Patient Name:   Justin Hardy Date of Exam: 08/15/2019 Medical Rec #:  [id number]     Height:       72.0 in Accession #:    [id number]    Weight:       172.5 lb Date of Birth:  1970-05-21    BSA:          2.001 m Patient Age:    48 years      BP:           203/63 mmHg Patient Gender: M             HR:           43 bpm. Exam Location:  Inpatient Procedure: 2D Echo, Cardiac Doppler, Color Doppler and Saline Contrast Bubble            Study Indications:    TIA  History:        Patient has no prior history of Echocardiogram examinations.                 Signs/Symptoms:Dizziness/Lightheadedness; Risk Factors:Current                 Smoker. ETOH. Poly substance abuse. Marijuana and cocaine use.  Sonographer:    Sheralyn Boatman RDCS Referring Phys: [id number] SENDIL K [facility] IMPRESSIONS  1. Left ventricular ejection fraction, by estimation, is 60 to 65%. The left ventricle has normal function. The left ventricle has no regional wall motion abnormalities. There is mild left ventricular hypertrophy. Left ventricular diastolic parameters were normal.  2. Right ventricular systolic  function is normal. The right ventricular size is normal. Tricuspid regurgitation signal is inadequate for assessing PA pressure.  3. The mitral valve is normal in structure. No evidence of mitral valve regurgitation.  4. The aortic valve was not well visualized. Aortic valve regurgitation is not visualized. No aortic stenosis is present.  5. The inferior vena cava is normal in size with greater than 50% respiratory variability, suggesting right atrial pressure of 3 mmHg.  6. Agitated saline contrast bubble study was negative, with no evidence of any interatrial shunt. FINDINGS  Left Ventricle: Left ventricular ejection fraction, by estimation, is 60 to 65%. The left ventricle has normal function. The left ventricle has no regional wall motion abnormalities. The left ventricular internal cavity size was normal in size. There is  mild left ventricular hypertrophy. Left ventricular diastolic parameters were normal. Right Ventricle: The right ventricular size is normal. No increase in right ventricular wall thickness. Right ventricular systolic function is normal. Tricuspid regurgitation signal is inadequate for assessing PA pressure. Left Atrium: Left atrial size was normal in size. Right Atrium: Right atrial size was normal in size. Pericardium: There is no evidence of pericardial effusion. Mitral Valve: The mitral valve is normal in structure. No evidence of mitral valve regurgitation. Tricuspid Valve: The tricuspid valve is normal in structure. Tricuspid valve regurgitation is not demonstrated. Aortic Valve: The aortic valve was not well visualized. Aortic valve regurgitation is not visualized. No aortic stenosis is present. Pulmonic Valve: The pulmonic valve was not well visualized. Pulmonic valve regurgitation is not visualized. Aorta: The aortic root and ascending aorta are structurally normal, with no evidence of dilitation, the aortic root is normal in size and structure and aortic root could not be assessed.  Venous: The inferior vena cava is normal in size with greater than 50% respiratory variability, suggesting right atrial pressure of 3 mmHg. IAS/Shunts: No atrial level shunt detected by color flow Doppler. Agitated saline contrast was given intravenously to evaluate for intracardiac shunting. Agitated saline contrast bubble study was negative, with no evidence of any interatrial shunt.  LEFT VENTRICLE PLAX 2D LVIDd:         3.96 cm     Diastology LVIDs:         2.46 cm     LV e' lateral:   14.60 cm/s LV PW:         1.20 cm     LV E/e' lateral: 6.1 LV IVS:        1.12 cm     LV e' medial:    9.68 cm/s LVOT diam:     1.80 cm     LV E/e' medial:  9.2 LV SV:         59 LV SV Index:   30 LVOT Area:     2.54 cm  LV Volumes (MOD) LV vol d, MOD A2C: 67.8 ml LV vol d, MOD A4C: 57.5 ml LV vol s, MOD A2C: 19.5 ml LV vol s, MOD A4C: 25.8 ml LV SV MOD A2C:     48.2 ml LV SV MOD A4C:     57.5 ml LV SV MOD BP:      38.0 ml RIGHT VENTRICLE             IVC RV S prime:     10.30 cm/s  IVC diam: 1.61 cm TAPSE (M-mode): 2.1 cm LEFT ATRIUM             Index       RIGHT ATRIUM           Index LA diam:        3.70 cm 1.85 cm/m  RA Area:     15.70 cm  LA Vol (A2C):   37.7 ml 18.84 ml/m RA Volume:   38.70 ml  19.34 ml/m LA Vol (A4C):   37.0 ml 18.49 ml/m LA Biplane Vol: 37.9 ml 18.94 ml/m  AORTIC VALVE LVOT Vmax:   110.00 cm/s LVOT Vmean:  73.600 cm/s LVOT VTI:    0.232 m  AORTA Ao Root diam: 3.60 cm Ao Asc diam:  3.30 cm MITRAL VALVE MV Area (PHT): 3.08 cm    SHUNTS MV Decel Time: 246 msec    Systemic VTI:  0.23 m MV E velocity: 89.10 cm/s  Systemic Diam: 1.80 cm MV A velocity: 48.00 cm/s MV E/A ratio:  1.86 Epifanio Lesches MD Electronically signed by Epifanio Lesches MD Signature Date/Time: 08/16/2019/12:53:34 AM    Final    CT Angio Chest/Abd/Pel for Dissection W and/or Wo Contrast  Result Date: 08/15/2019 CLINICAL DATA:  Flank pain with kidney stone suspected. Chest pain and slurred speech per prior notes. EXAM: CT  ANGIOGRAPHY CHEST, ABDOMEN AND PELVIS TECHNIQUE: Multidetector CT imaging through the chest, abdomen and pelvis was performed using the standard protocol during bolus administration of intravenous contrast. Multiplanar reconstructed images and MIPs were obtained and reviewed to evaluate the vascular anatomy. CONTRAST:  OMNIPAQUE IOHEXOL 350 MG/ML SOLN COMPARISON:  None. FINDINGS: CTA CHEST FINDINGS Cardiovascular: The noncontrast phase has intravascular contrast related to prior CTA. No evidence of intramural hematoma. Negative for aortic dissection or aneurysm. No visible pulmonary artery filling defects. Normal heart size. No pericardial effusion. Mediastinum/Nodes: Negative for adenopathy or hematoma. No pneumomediastinum. Lungs/Pleura: Airway thickening. There is no edema, consolidation, effusion, or pneumothorax. Musculoskeletal: No acute finding Review of the MIP images confirms the above findings. CTA ABDOMEN AND PELVIS FINDINGS VASCULAR Aorta: Minimal atheromatous wall thickening. No aneurysm or dissection. Celiac: Vessels are smooth and widely patent SMA: Vessels are smooth and widely patent Renals: Single bilateral renal arteries that are smooth and widely patent. Negative for aneurysm. IMA: Patent Inflow: Vessels are smooth and widely patent. Veins: Negative Review of the MIP images confirms the above findings. NON-VASCULAR Hepatobiliary: 3 small cystic densities within the liver.No evidence of biliary obstruction or stone. Pancreas: Unremarkable. Spleen: Unremarkable. Adrenals/Urinary Tract: Negative adrenals. No hydronephrosis or obstructing stone. There is excreting contrast related to prior neck CTA, which could obscure small calculi. Unremarkable bladder. Stomach/Bowel:  No obstruction. No appendicitis. Lymphatic: No mass or adenopathy. Reproductive:No pathologic findings. Other: No ascites or pneumoperitoneum. Musculoskeletal: No acute abnormalities. Review of the MIP images confirms the above  findings. IMPRESSION: 1. Negative for acute aortic syndrome or other explanation for symptoms. 2. Minimal atheromatous changes. Electronically Signed   By: Marnee Spring M.D.   On: 08/15/2019 05:47   CT HEAD CODE STROKE WO CONTRAST  Result Date: 08/15/2019 CLINICAL DATA:  Code stroke. Shortness of breath and back/abdominal pain. Slurred speech EXAM: CT HEAD WITHOUT CONTRAST TECHNIQUE: Contiguous axial images were obtained from the base of the skull through the vertex without intravenous contrast. COMPARISON:  None. FINDINGS: Brain: No evidence of acute infarction, hemorrhage, hydrocephalus, extra-axial collection or mass lesion/mass effect. Vascular: No hyperdense vessel or unexpected calcification. Skull: Normal. Negative for fracture or focal lesion. Sinuses/Orbits: Negative Other: There is underway CTA of the chest, head, and neck. ASPECTS [facility] Stroke Program Early CT Score) - Ganglionic level infarction (caudate, lentiform nuclei, internal capsule, insula, M1-M3 cortex): 7 - Supraganglionic infarction (M4-M6 cortex): 3 Total score (0-10 with 10 being normal): 10 IMPRESSION: Negative head CT.  ASPECTS is 10. Electronically Signed   By: Marja Kays  Watts M.D.   On: 08/15/2019 04:49    Microbiology: Recent Results (from the past 240 hour(s))  Respiratory Panel by RT PCR (Flu A&B, Covid) - Nasopharyngeal Swab     Status: None   Collection Time: 08/15/19  5:54 AM   Specimen: Nasopharyngeal Swab  Result Value Ref Range Status   SARS Coronavirus 2 by RT PCR NEGATIVE NEGATIVE Final    Comment: (NOTE) SARS-CoV-2 target nucleic acids are NOT DETECTED. The SARS-CoV-2 RNA is generally detectable in upper respiratoy specimens during the acute phase of infection. The lowest concentration of SARS-CoV-2 viral copies this assay can detect is 131 copies/mL. A negative result does not preclude SARS-Cov-2 infection and should not be used as the sole basis for treatment or other patient management decisions.  A negative result may occur with  improper specimen collection/handling, submission of specimen other than nasopharyngeal swab, presence of viral mutation(s) within the areas targeted by this assay, and inadequate number of viral copies (<131 copies/mL). A negative result must be combined with clinical observations, patient history, and epidemiological information. The expected result is Negative. Fact Sheet for Patients:  https://www.moore.com/https://www.fda.gov/media/142436/download Fact Sheet for Healthcare Providers:  https://www.young.biz/https://www.fda.gov/media/142435/download This test is not yet ap proved or cleared by the [place] FDA and  has been authorized for detection and/or diagnosis of SARS-CoV-2 by FDA under an Emergency Use Authorization (EUA). This EUA will remain  in effect (meaning this test can be used) for the duration of the COVID-19 declaration under Section 564(b)(1) of the Act, 21 U.S.C. section 360bbb-3(b)(1), unless the authorization is terminated or revoked sooner.    Influenza A by PCR NEGATIVE NEGATIVE Final   Influenza B by PCR NEGATIVE NEGATIVE Final    Comment: (NOTE) The Xpert Xpress SARS-CoV-2/FLU/RSV assay is intended as an aid in  the diagnosis of influenza from Nasopharyngeal swab specimens and  should not be used as a sole basis for treatment. Nasal washings and  aspirates are unacceptable for Xpert Xpress SARS-CoV-2/FLU/RSV  testing. Fact Sheet for Patients: https://www.moore.com/https://www.fda.gov/media/142436/download Fact Sheet for Healthcare Providers: https://www.young.biz/https://www.fda.gov/media/142435/download This test is not yet approved or cleared by the [place] FDA and  has been authorized for detection and/or diagnosis of SARS-CoV-2 by  FDA under an Emergency Use Authorization (EUA). This EUA will remain  in effect (meaning this test can be used) for the duration of the  Covid-19 declaration under Section 564(b)(1) of the Act, 21  U.S.C. section 360bbb-3(b)(1), unless the authorization is    terminated or revoked. Performed at [facility], [address] [place], [place] [id number]      Labs: Basic Metabolic Panel: Recent Labs  Lab 08/15/19 0305  NA 139  K 3.4*  CL 103  CO2 24  GLUCOSE 111*  BUN 22*  CREATININE 1.05  CALCIUM 9.1   Liver Function Tests: Recent Labs  Lab 08/15/19 0305  AST 13*  ALT 20  ALKPHOS 60  BILITOT 0.4  PROT 7.4  ALBUMIN 4.5   Recent Labs  Lab 08/15/19 0305  LIPASE 26   No results for input(s): AMMONIA in the last 168 hours. CBC: Recent Labs  Lab 08/15/19 0305 08/15/19 0427  WBC 7.6  --   NEUTROABS  --  6.6  HGB 16.0  --   HCT 47.2  --   MCV 99.2  --   PLT 180  --    Cardiac Enzymes: No results for input(s): CKTOTAL, CKMB, CKMBINDEX, TROPONINI in the last 168 hours. BNP: BNP (last 3 results) No results  for input(s): BNP in the last 8760 hours.  ProBNP (last 3 results) No results for input(s): PROBNP in the last 8760 hours.  CBG: No results for input(s): GLUCAP in the last 168 hours.     Signed:  Kayleen Memos, MD [facility] 08/16/2019, 11:17 AM

## 2019-08-17 ENCOUNTER — Other Ambulatory Visit: Payer: Self-pay

## 2019-08-17 ENCOUNTER — Encounter: Payer: Self-pay | Admitting: Neurology

## 2019-08-17 ENCOUNTER — Ambulatory Visit: Payer: No Typology Code available for payment source | Admitting: Neurology

## 2019-08-17 VITALS — BP 114/73 | HR 62 | Temp 97.9°F | Ht 72.0 in | Wt 170.0 lb

## 2019-08-17 DIAGNOSIS — R404 Transient alteration of awareness: Secondary | ICD-10-CM | POA: Insufficient documentation

## 2019-08-17 DIAGNOSIS — R42 Dizziness and giddiness: Secondary | ICD-10-CM

## 2019-08-17 DIAGNOSIS — R410 Disorientation, unspecified: Secondary | ICD-10-CM | POA: Insufficient documentation

## 2019-08-17 NOTE — Progress Notes (Signed)
PATIENT: Justin Hardy DOB: 05/04/1971  Chief Complaint  Patient presents with  . Lightheaded/Slurred Speech    He was seen in ED on 08/15/19 to r/o stroke. Reports episode of lightheadedness, slurred speech and difficulty w/ motor skills. Labs positive for cocaine and THC.   Marland Kitchen PCP    No established PCP. Referred from ED.     HISTORICAL  Justin Hardy is a 49 year old male, seen in request by emergency room for evaluation of sudden onset dizziness, confusion, initial evaluation was on August 17, 2019.  I have reviewed and summarized the referring note from the referring physician.  He has developed left lower abdominal pain over the past few days, he did report using cocaine and marijuana Saturday August 13, 2019 with high school friends gathering, Sunday night on March 28 he went to bed around 10:30 PM, wake up 2:30 AM to let his dog out, then suddenly felt dizzy when standing up, he sat down without improving of his symptoms, also noticed tightness at the left side of his body, involving left lower abdomen, left chest, left arm, when he tried to communicate with his wife, he felt confused, his wife drove him to emergency room, En route, he had difficulty communicating with his wife to turn up the temperature of the car, and he could not do it either.  His confusion lasted about 1 hour, gradually improved, he had extensive evaluation during his ER stay, blood pressure was 137/96, pulse of 91, at times bradycardia, but otherwise no significant abnormality, other than the drug screen was positive for cocaine and marijuana, he also reported a history of binge alcohol use sometimes, smoke at least half pack a day, works as a Engineer, production, require ladder climbing,  I personally reviewed MRI of the brain without contrast August 15, 2019: No acute abnormality CT angiogram of head and neck was normal CT angiogram of the chest was negative  Laboratory evaluation showed normal A1c, D-dimer, CBC,  CMP showed potassium of 3.4, UDS was positive for cocaine, and marijuana Echocardiogram was normal Telemetry showed no significant abnormalities  REVIEW OF SYSTEMS: Full 14 system review of systems performed and notable only for as above All other review of systems were negative.  ALLERGIES: No Known Allergies  HOME MEDICATIONS: Current Outpatient Medications  Medication Sig Dispense Refill  . aspirin EC 81 MG EC tablet Take 1 tablet (81 mg total) by mouth daily. 120 tablet 0  . atorvastatin (LIPITOR) 80 MG tablet Take 1 tablet (80 mg total) by mouth daily at 6 PM. 90 tablet 0  . ibuprofen (ADVIL) 200 MG tablet Take 400 mg by mouth every 6 (six) hours as needed for headache or moderate pain.    Marland Kitchen albuterol (PROVENTIL HFA;VENTOLIN HFA) 108 (90 BASE) MCG/ACT inhaler Inhale 1-2 puffs into the lungs every 6 (six) hours as needed for wheezing. 1 Inhaler 0   No current facility-administered medications for this visit.    PAST MEDICAL HISTORY: Past Medical History:  Diagnosis Date  . Asthma     PAST SURGICAL HISTORY: History reviewed. No pertinent surgical history.  FAMILY HISTORY: Family History  Problem Relation Age of Onset  . Healthy Mother   . Prostate cancer Father     SOCIAL HISTORY: Social History   Socioeconomic History  . Marital status: Married    Spouse name: Not on file  . Number of children: 0  . Years of education: 40  . Highest education level: High school graduate  Occupational History  .  Occupation: Solicitor for [employer]  Tobacco Use  . Smoking status: Current Every Day Smoker    Packs/day: 0.50    Types: Cigarettes  . Smokeless tobacco: Never Used  Substance and Sexual Activity  . Alcohol use: Yes    Comment: Weekend drinks - ususally when out of town   . Drug use: Yes    Types: Marijuana, Cocaine    Comment: Marijuana once weekly, cocaine twice yearly  . Sexual activity: Not on file  Other Topics Concern  . Not on file    Social History Narrative   Lives at home with wife.   Right-handed.   He has step-children.   Occasional caffeine use.   Social Determinants of Health   Financial Resource Strain:   . Difficulty of Paying Living Expenses:   Food Insecurity:   . Worried About Charity fundraiser in the Last Year:   . Arboriculturist in the Last Year:   Transportation Needs:   . Film/video editor (Medical):   Marland Kitchen Lack of Transportation (Non-Medical):   Physical Activity:   . Days of Exercise per Week:   . Minutes of Exercise per Session:   Stress:   . Feeling of Stress :   Social Connections:   . Frequency of Communication with Friends and Family:   . Frequency of Social Gatherings with Friends and Family:   . Attends Religious Services:   . Active Member of Clubs or Organizations:   . Attends Archivist Meetings:   Marland Kitchen Marital Status:   Intimate Partner Violence:   . Fear of Current or Ex-Partner:   . Emotionally Abused:   Marland Kitchen Physically Abused:   . Sexually Abused:      PHYSICAL EXAM   Vitals:   08/17/19 0821  BP: 114/73  Pulse: 62  Temp: 97.9 F (36.6 C)  Weight: 170 lb (77.1 kg)  Height: 6' (1.829 m)    Not recorded      Body mass index is 23.06 kg/m.  PHYSICAL EXAMNIATION:  Gen: NAD, conversant, well nourised, well groomed                     Cardiovascular: Regular rate rhythm, no peripheral edema, warm, nontender. Eyes: Conjunctivae clear without exudates or hemorrhage Neck: Supple, no carotid bruits. Pulmonary: Clear to auscultation bilaterally   NEUROLOGICAL EXAM:  MENTAL STATUS: Speech:    Speech is normal; fluent and spontaneous with normal comprehension.  Cognition:     Orientation to time, place and person     Normal recent and remote memory     Normal Attention span and concentration     Normal Language, naming, repeating,spontaneous speech     Fund of knowledge   CRANIAL NERVES: CN II: Visual fields are full to confrontation. Pupils  are round equal and briskly reactive to light. CN III, IV, VI: extraocular movement are normal. No ptosis. CN V: Facial sensation is intact to light touch CN VII: Face is symmetric with normal eye closure  CN VIII: Hearing is normal to causal conversation. CN IX, X: Phonation is normal. CN XI: Head turning and shoulder shrug are intact  MOTOR: There is no pronator drift of out-stretched arms. Muscle bulk and tone are normal. Muscle strength is normal.  REFLEXES: Reflexes are 2+ and symmetric at the biceps, triceps, knees, and ankles. Plantar responses are flexor.  SENSORY: Intact to light touch, pinprick and vibratory sensation are intact in fingers and toes.  COORDINATION: There is no trunk or limb dysmetria noted.  GAIT/STANCE: Posture is normal. Gait is steady with normal steps, base, arm swing, and turning. Heel and toe walking are normal. Tandem gait is normal.  Romberg is absent.   DIAGNOSTIC DATA (LABS, IMAGING, TESTING) - I reviewed patient records, labs, notes, testing and imaging myself where available.   ASSESSMENT AND PLAN  Taejon Irani is a 49 y.o. male    Transient confusion, word finding difficulties  In the setting of polysubstance abuse, cocaine, marijuana  Most likely related to the hypoperfusion of the brain, under influence of the substance, remote possibility of partial seizure  EEG   Levert Feinstein, M.D. Ph.D.  [facility] [address] [place], [place] [address] Ph: [phone] Fax: [phone]  CC: Referring Provider

## 2019-09-05 ENCOUNTER — Ambulatory Visit (INDEPENDENT_AMBULATORY_CARE_PROVIDER_SITE_OTHER): Payer: No Typology Code available for payment source | Admitting: Neurology

## 2019-09-05 ENCOUNTER — Other Ambulatory Visit: Payer: Self-pay

## 2019-09-05 DIAGNOSIS — R410 Disorientation, unspecified: Secondary | ICD-10-CM

## 2019-09-05 DIAGNOSIS — R42 Dizziness and giddiness: Secondary | ICD-10-CM

## 2019-09-06 NOTE — Procedures (Signed)
   HISTORY: 49 year old male complains of sudden onset dizziness, confusion  TECHNIQUE:  This is a routine 16 channel EEG recording with one channel devoted to a limited EKG recording.  It was performed during wakefulness, drowsiness and asleep.  Hyperventilation and photic stimulation were performed as activating procedures.  There are minimum muscle and movement artifact noted.  Upon maximum arousal, posterior dominant waking rhythm consistent of rhythmic alpha range activity, with frequency of 9 to 10 Hz hz. Activities are symmetric over the bilateral posterior derivations and attenuated with eye opening.  Hyperventilation produced mild/moderate buildup with higher amplitude and the slower activities noted.  Photic stimulation did not alter the tracing.  During EEG recording, patient developed drowsiness and entered sleep, sleep EEG demonstrated architecture, there were frontal centrally dominant vertex waves and symmetric sleep spindles noted.  During EEG recording, there was no epileptiform discharge noted.  EKG demonstrate sinus rhythm, with heart rate of  CONCLUSION: This is a  normal normal awake and sleep EEG.  There is no electrodiagnostic evidence of epileptiform discharge.  Levert Feinstein, M.D. Ph.D.  [facility] [address] [place], [place] [address] Phone: [phone] Fax:      [phone]

## 2020-08-10 ENCOUNTER — Other Ambulatory Visit: Payer: Self-pay | Admitting: Internal Medicine

## 2020-08-10 DIAGNOSIS — E876 Hypokalemia: Secondary | ICD-10-CM

## 2021-04-21 ENCOUNTER — Other Ambulatory Visit: Payer: Self-pay

## 2021-04-21 ENCOUNTER — Encounter (HOSPITAL_COMMUNITY): Payer: Self-pay

## 2021-04-21 ENCOUNTER — Emergency Department (HOSPITAL_COMMUNITY): Payer: BC Managed Care – PPO

## 2021-04-21 ENCOUNTER — Emergency Department (HOSPITAL_COMMUNITY)
Admission: EM | Admit: 2021-04-21 | Discharge: 2021-04-21 | Disposition: A | Discharge status: Home / Self Care | Payer: BC Managed Care – PPO | Attending: Emergency Medicine | Admitting: Emergency Medicine

## 2021-04-21 DIAGNOSIS — F17210 Nicotine dependence, cigarettes, uncomplicated: Secondary | ICD-10-CM | POA: Insufficient documentation

## 2021-04-21 DIAGNOSIS — R091 Pleurisy: Secondary | ICD-10-CM | POA: Insufficient documentation

## 2021-04-21 DIAGNOSIS — J45909 Unspecified asthma, uncomplicated: Secondary | ICD-10-CM | POA: Diagnosis not present

## 2021-04-21 DIAGNOSIS — R079 Chest pain, unspecified: Secondary | ICD-10-CM | POA: Diagnosis present

## 2021-04-21 DIAGNOSIS — R0682 Tachypnea, not elsewhere classified: Secondary | ICD-10-CM | POA: Insufficient documentation

## 2021-04-21 LAB — CBC
HCT: 45.3 % (ref 39.0–52.0)
Hemoglobin: 16 g/dL (ref 13.0–17.0)
MCH: 34.1 pg — ABNORMAL HIGH (ref 26.0–34.0)
MCHC: 35.3 g/dL (ref 30.0–36.0)
MCV: 96.6 fL (ref 80.0–100.0)
Platelets: 173 10*3/uL (ref 150–400)
RBC: 4.69 MIL/uL (ref 4.22–5.81)
RDW: 12.2 % (ref 11.5–15.5)
WBC: 6.4 10*3/uL (ref 4.0–10.5)
nRBC: 0 % (ref 0.0–0.2)

## 2021-04-21 LAB — BASIC METABOLIC PANEL
Anion gap: 4 — ABNORMAL LOW (ref 5–15)
BUN: 16 mg/dL (ref 6–20)
CO2: 26 mmol/L (ref 22–32)
Calcium: 9.1 mg/dL (ref 8.9–10.3)
Chloride: 105 mmol/L (ref 98–111)
Creatinine, Ser: 0.92 mg/dL (ref 0.61–1.24)
GFR, Estimated: >60 mL/min (ref >60)
Glucose, Bld: 95 mg/dL (ref 70–99)
Potassium: 3.4 mmol/L — ABNORMAL LOW (ref 3.5–5.1)
Sodium: 135 mmol/L (ref 135–145)

## 2021-04-21 LAB — TROPONIN I (HIGH SENSITIVITY)
Troponin I (High Sensitivity): 3 ng/L (ref <18)
Troponin I (High Sensitivity): 2 ng/L (ref <18)

## 2021-04-21 MED ORDER — CYCLOBENZAPRINE HCL 10 MG PO TABS: 10.0000 mg | ORAL_TABLET | Freq: Two times a day (BID) | ORAL | 0 refills | Status: AC | PRN

## 2021-04-21 MED ORDER — MORPHINE SULFATE (PF) 4 MG/ML IV SOLN
4.0000 mg | Freq: Once | INTRAVENOUS | Status: AC
  Administered 2021-04-21: 08:00:00 4 mg via INTRAVENOUS
  Filled 2021-04-21: qty 1

## 2021-04-21 MED ORDER — IOHEXOL 350 MG/ML SOLN
80.0000 mL | Freq: Once | INTRAVENOUS | Status: AC | PRN
  Administered 2021-04-21: 08:00:00 80 mL via INTRAVENOUS

## 2021-04-21 MED ORDER — NAPROXEN 375 MG PO TABS: 375.0000 mg | ORAL_TABLET | Freq: Two times a day (BID) | ORAL | 0 refills | Status: AC

## 2021-04-21 NOTE — ED Provider Notes (Signed)
[facility]-EMERGENCY DEPT Provider Note   CSN: [id number] Arrival date & time: 04/21/21  0545     History Chief Complaint  Patient presents with   Chest Pain    Justin Hardy is a 50 y.o. male.   Chest Pain  HPI: A 50 year old patient with a history of hypercholesterolemia presents for evaluation of chest pain. Initial onset of pain was approximately 1-3 hours ago. The patient's chest pain is sharp and is not worse with exertion. The patient's chest pain is middle- or left-sided, is not well-localized, is not described as heaviness/pressure/tightness and does not radiate to the arms/jaw/neck. The patient does not complain of nausea and denies diaphoresis. The patient has smoked in the past 90 days. The patient has no history of stroke, has no history of peripheral artery disease, denies any history of treated diabetes, has no relevant family history of coronary artery disease (first degree relative at less than age 86), is not hypertensive and does not have an elevated BMI (>=30).  Patient states the pain is sharp in his chest.  It goes across into his back.  He also feels like a deep breath because of pain and discomfort.  No fevers or chills.  No URI symptoms.  No leg swelling.  No known history of heart or lung disease Past Medical History:  Diagnosis Date   Asthma     Patient Active Problem List   Diagnosis Date Noted   Alteration consciousness 08/17/2019   Confusion 08/17/2019   Neurological symptoms 08/15/2019   Polysubstance abuse (HCC) 08/15/2019   Alcohol dependence, binge pattern (HCC) 08/15/2019   Tobacco abuse 08/15/2019   Left flank pain 08/15/2019   Asthma    Dizziness    Dysarthria    Bradycardia     History reviewed. No pertinent surgical history.     Family History  Problem Relation Age of Onset   Healthy Mother    Prostate cancer Father     Social History   Tobacco Use   Smoking status: Every Day    Packs/day: 0.50     Types: Cigarettes   Smokeless tobacco: Never  Vaping Use   Vaping Use: Never used  Substance Use Topics   Alcohol use: Yes    Comment: Weekend drinks - ususally when out of town    Drug use: Yes    Types: Marijuana, Cocaine    Comment: Marijuana once weekly, cocaine twice yearly    Home Medications Prior to Admission medications   Medication Sig Start Date End Date Taking? Authorizing Provider  cyclobenzaprine (FLEXERIL) 10 MG tablet Take 1 tablet (10 mg total) by mouth 2 (two) times daily as needed for muscle spasms. 04/21/21  Yes Linwood Dibbles, MD  naproxen (NAPROSYN) 375 MG tablet Take 1 tablet (375 mg total) by mouth 2 (two) times daily. 04/21/21  Yes Linwood Dibbles, MD  albuterol (PROVENTIL HFA;VENTOLIN HFA) 108 (90 BASE) MCG/ACT inhaler Inhale 1-2 puffs into the lungs every 6 (six) hours as needed for wheezing. 05/02/11 08/15/19  Quita Skye, MD  atorvastatin (LIPITOR) 80 MG tablet Take 1 tablet (80 mg total) by mouth daily at 6 PM. 08/16/19 11/14/19  Darlin Drop, DO  ibuprofen (ADVIL) 200 MG tablet Take 400 mg by mouth every 6 (six) hours as needed for headache or moderate pain.    [provider]    Allergies    Patient has no known allergies.  Review of Systems   Review of Systems  Cardiovascular:  Positive for  chest pain.  All other systems reviewed and are negative.  Physical Exam Updated Vital Signs BP 137/75   Pulse 69   Temp 97.9 F (36.6 C) (Oral)   Resp 17   Ht 1.829 m (6')   Wt 77.1 kg   SpO2 98%   BMI 23.06 kg/m   Physical Exam Vitals and nursing note reviewed.  Constitutional:      General: He is not in acute distress.    Appearance: He is well-developed.  HENT:     Head: Normocephalic and atraumatic.     Right Ear: External ear normal.     Left Ear: External ear normal.  Eyes:     General: No scleral icterus.       Right eye: No discharge.        Left eye: No discharge.     Conjunctiva/sclera: Conjunctivae normal.  Neck:     Trachea: No  tracheal deviation.  Cardiovascular:     Rate and Rhythm: Normal rate and regular rhythm.  Pulmonary:     Effort: Pulmonary effort is normal. Tachypnea present. No respiratory distress.     Breath sounds: Normal breath sounds. No stridor. No wheezing or rales.  Abdominal:     General: Bowel sounds are normal. There is no distension.     Palpations: Abdomen is soft.     Tenderness: There is no abdominal tenderness. There is no guarding or rebound.  Musculoskeletal:        General: No tenderness or deformity.     Cervical back: Neck supple.  Skin:    General: Skin is warm and dry.     Findings: No rash.  Neurological:     General: No focal deficit present.     Mental Status: He is alert.     Cranial Nerves: No cranial nerve deficit (no facial droop, extraocular movements intact, no slurred speech).     Sensory: No sensory deficit.     Motor: No abnormal muscle tone or seizure activity.     Coordination: Coordination normal.  Psychiatric:        Mood and Affect: Mood normal.    ED Results / Procedures / Treatments   Labs (all labs ordered are listed, but only abnormal results are displayed) Labs Reviewed  BASIC METABOLIC PANEL - Abnormal; Notable for the following components:      Result Value   Potassium 3.4 (*)    Anion gap 4 (*)    All other components within normal limits  CBC - Abnormal; Notable for the following components:   MCH 34.1 (*)    All other components within normal limits  TROPONIN I (HIGH SENSITIVITY)  TROPONIN I (HIGH SENSITIVITY)    EKG EKG Interpretation  Date/Time:  Sunday April 21 2021 05:58:46 EST Ventricular Rate:  70 PR Interval:    QRS Duration: 103 QT Interval:  394 QTC Calculation: 426 R Axis:   13 Text Interpretation: Normal sinus rhythm RSR' in V1 or V2, right VCD or RVH Confirmed by Glynn Octave [phone]) on 04/21/2021 6:02:11 AM  Radiology DG Chest 2 View  Result Date: 04/21/2021 CLINICAL DATA:  50 year old male with chest  pain and shortness of breath. Smoker. EXAM: CHEST - 2 VIEW COMPARISON:  CTA Chest, Abdomen, and Pelvis 08/15/2019 and earlier. FINDINGS: PA and lateral views. Larger lung volumes than last year. Mediastinal contours remain normal. Visualized tracheal air column is within normal limits. Right nipple shadow suspected. No pneumothorax, pulmonary edema, pleural effusion or confluent pulmonary opacity.  Stable increased interstitial markings throughout both lungs, likely smoking related. No acute osseous abnormality identified. Negative visible bowel gas. IMPRESSION: Stable probably smoking related pulmonary interstitial changes. No acute cardiopulmonary abnormality. Electronically Signed   By: Odessa Fleming M.D.   On: 04/21/2021 06:36   CT Angio Chest PE W and/or Wo Contrast  Result Date: 04/21/2021 CLINICAL DATA:  Chest wall pain this morning.  PE suspected. EXAM: CT ANGIOGRAPHY CHEST WITH CONTRAST TECHNIQUE: Multidetector CT imaging of the chest was performed using the standard protocol during bolus administration of intravenous contrast. Multiplanar CT image reconstructions and MIPs were obtained to evaluate the vascular anatomy. CONTRAST:  34mL OMNIPAQUE IOHEXOL 350 MG/ML SOLN COMPARISON:  Chest CT dated 08/15/2019. FINDINGS: Cardiovascular: There is no pulmonary embolism identified within the main, lobar or segmental pulmonary arteries bilaterally. No thoracic aortic aneurysm or evidence of aortic dissection. No pericardial effusion. Mediastinum/Nodes: No mass or enlarged lymph nodes are seen within the mediastinum or perihilar regions. Esophagus is unremarkable. Trachea and central bronchi are unremarkable. Lungs/Pleura: Lungs are clear.  No pleural effusion or pneumothorax. Upper Abdomen: Limited images of the upper abdomen are unremarkable. Musculoskeletal: Osseous structures about the chest are unremarkable. No chest wall abnormality seen. Review of the MIP images confirms the above findings. IMPRESSION: Negative  exam. No pulmonary embolism seen. Lungs are clear. No chest wall abnormality seen. Electronically Signed   By: Bary Richard M.D.   On: 04/21/2021 08:53    Procedures Procedures   Medications Ordered in ED Medications  morphine 4 MG/ML injection 4 mg (4 mg Intravenous Given 04/21/21 0748)  iohexol (OMNIPAQUE) 350 MG/ML injection 80 mL (80 mLs Intravenous Contrast Given 04/21/21 0825)    ED Course  I have reviewed the triage vital signs and the nursing notes.  Pertinent labs & imaging results that were available during my care of the patient were reviewed by me and considered in my medical decision making (see chart for details).  Clinical Course as of 04/21/21 0919  Sun Apr 21, 2021  9355 CBC normal.  Metabolic panel normal.  Troponin normal [JK]  0731 Chest x-ray without acute abnormality [JK]  0859 Serial troponins are normal. [JK]  0859 CBC and metabolic panel are normal [JK]    Clinical Course User Index [JK] Linwood Dibbles, MD   MDM Rules/Calculators/A&P HEAR Score: 2                         Presented to ED with complaints of sharp chest pain.  Presentation concerning for the possibility of pneumothorax, pneumonia, pulmonary embolism.  Less likely acute coronary syndrome.  ED work-up is reassuring.  Low risk heart score.  Serial troponins are normal.  I doubt ACS.  No pneumonia or pneumothorax on x-ray but with his presentation CT angiogram was performed to rule out PE.  No PE or other abnormality noted on CT.  Suspect at this point symptoms likely related to pleurisy.  Will discharge home with NSAIDs.  Discussed outpatient follow-up. Final Clinical Impression(s) / ED Diagnoses Final diagnoses:  Pleurisy    Rx / DC Orders ED Discharge Orders          Ordered    naproxen (NAPROSYN) 375 MG tablet  2 times daily        04/21/21 0918    cyclobenzaprine (FLEXERIL) 10 MG tablet  2 times daily PRN        04/21/21 2174  Linwood Dibbles, MD 04/21/21 [phone]

## 2021-04-21 NOTE — ED Triage Notes (Signed)
Patient was sleeping and he woke up and took a weird breathe, he thinks something pulled in his back and chest. He has asthma. But cannot catch his breath. The pain is persistent.

## 2021-04-21 NOTE — Discharge Instructions (Signed)
Take the medications as needed for pain.  Muscle accident may help as well if this is a pulled muscle.  Follow-up with your doctor to be rechecked if the symptoms are not improving in the next week

## 2021-10-23 IMAGING — CT CT HEAD CODE STROKE
3 series · 16 of 47 positions shown, 19 images · non-contrast
Comparison: None.

CLINICAL DATA: Code stroke. Shortness of breath and back/abdominal
pain. Slurred speech

EXAM:
CT HEAD WITHOUT CONTRAST
TECHNIQUE: Contiguous axial images were obtained from the base of the skull
through the vertex without intravenous contrast.

[Series 2: head wo · axial · 0.47mm/px · z∈[-169,-34]mm · 10 of 33 slices shown, 13 images]
[im 3/33  brain]
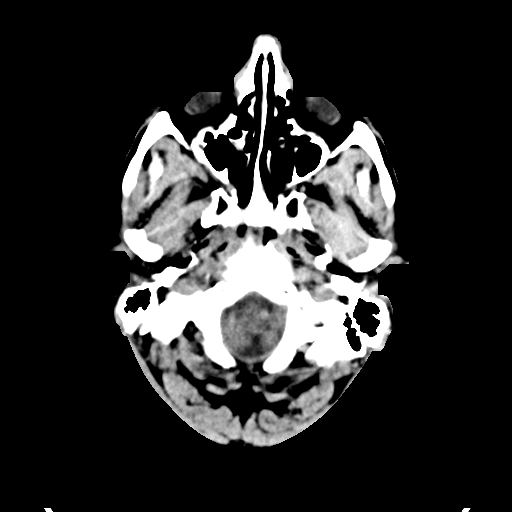
[im 3/33  bone]
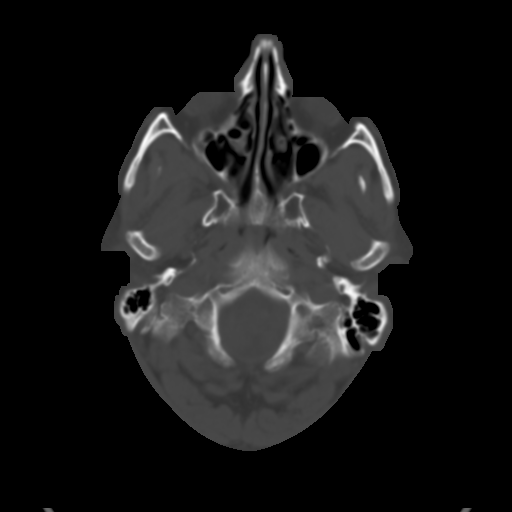
[im 6/33  brain]
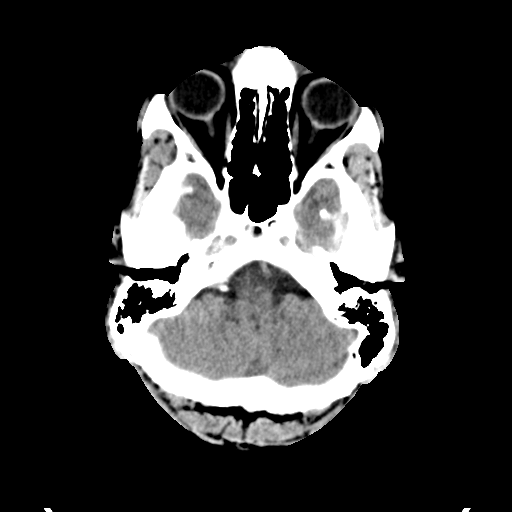
[im 9/33  brain]
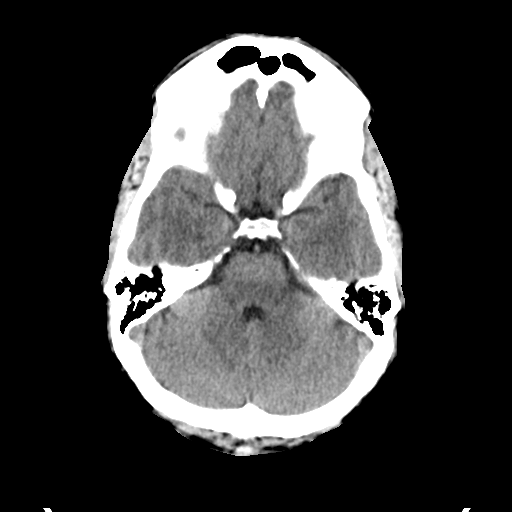
[im 12/33  brain]
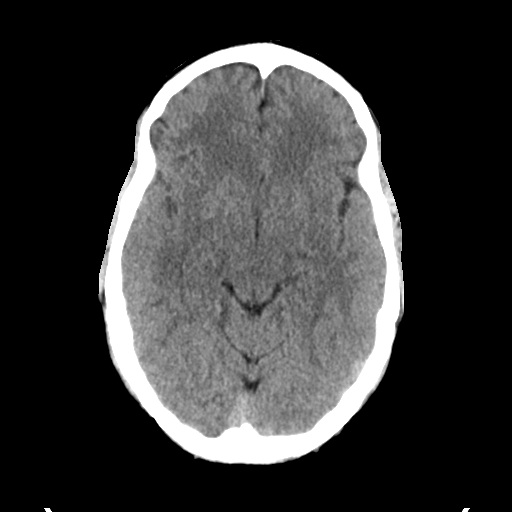
[im 15/33  brain]
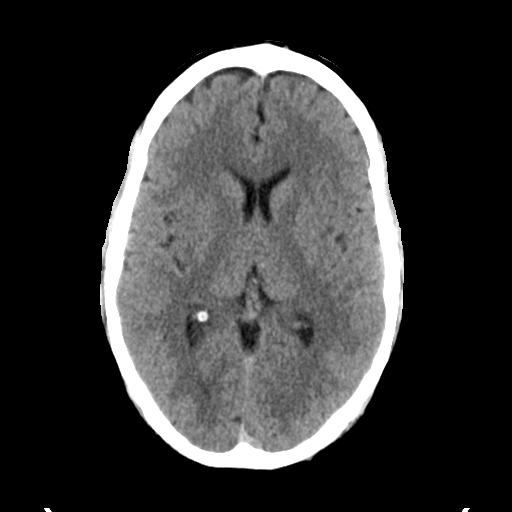
[im 15/33  bone]
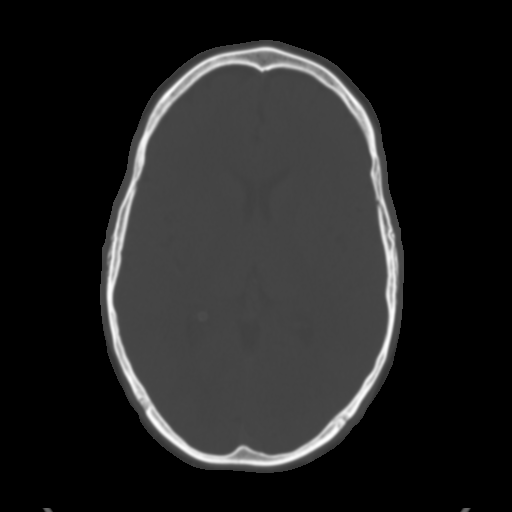
[im 18/33  brain]
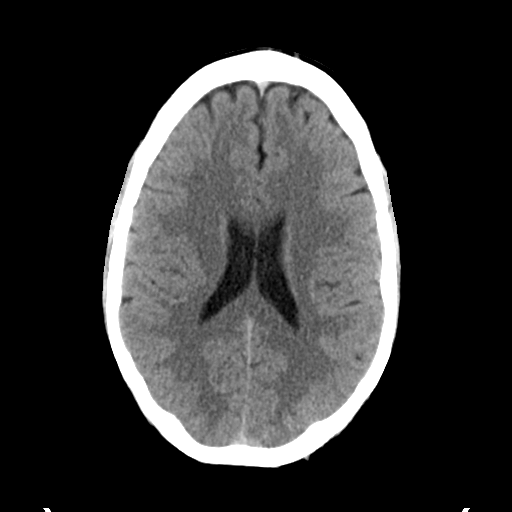
[im 21/33  brain]
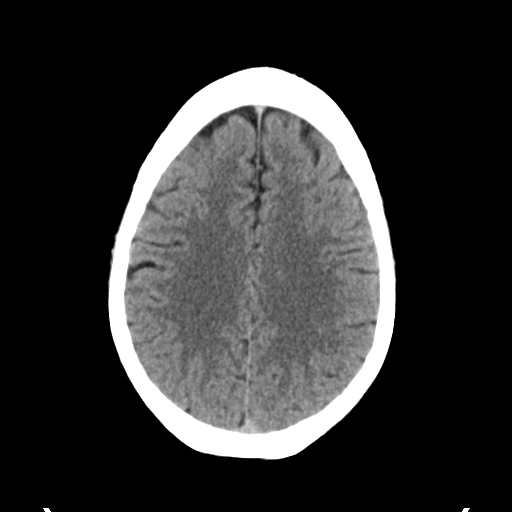
[im 25/33  brain]
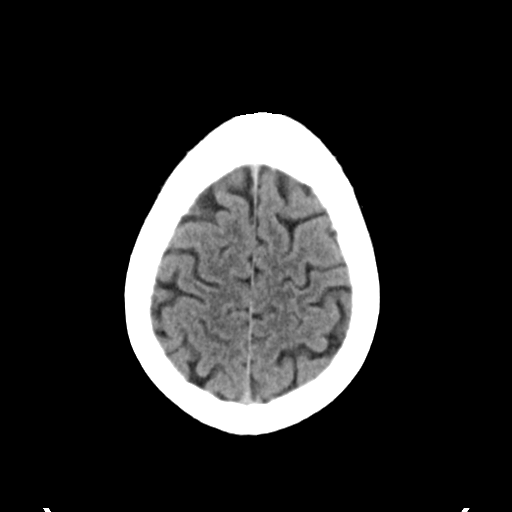
[im 27/33  brain]
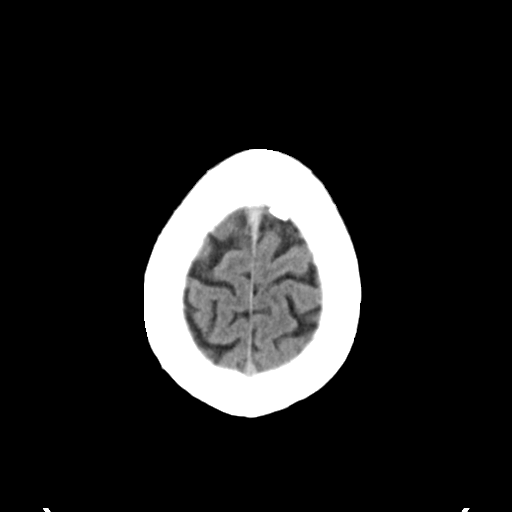
[im 27/33  bone]
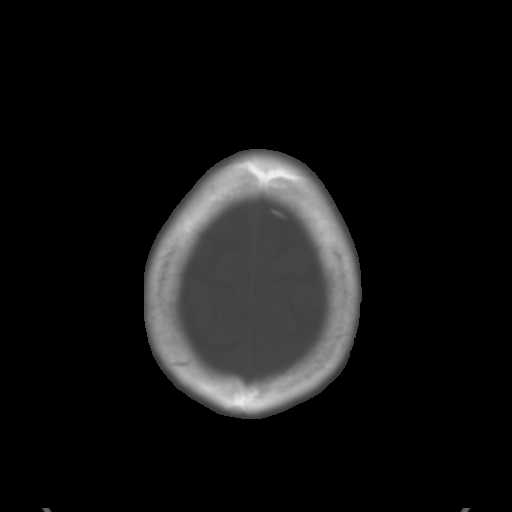
[im 30/33  brain]
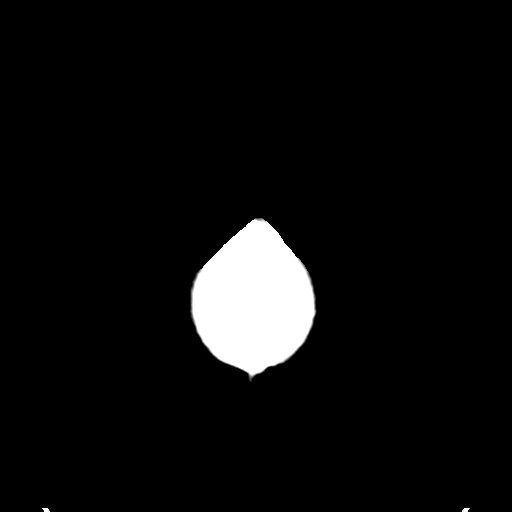

[Series 4: coronal soft tissue · coronal · 0.32mm/px · 3 of 73 slices shown]
[im 25/73  brain]
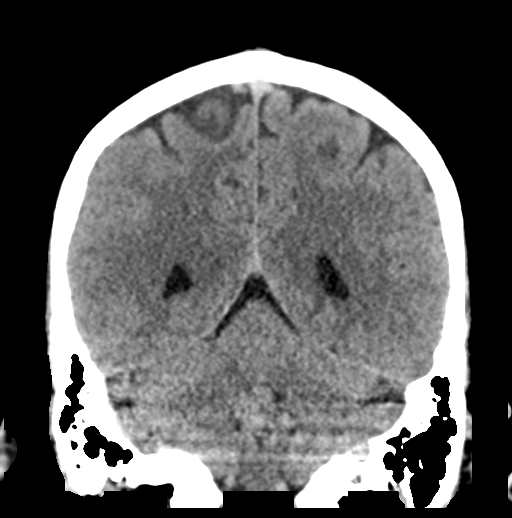
[im 33/73  brain]
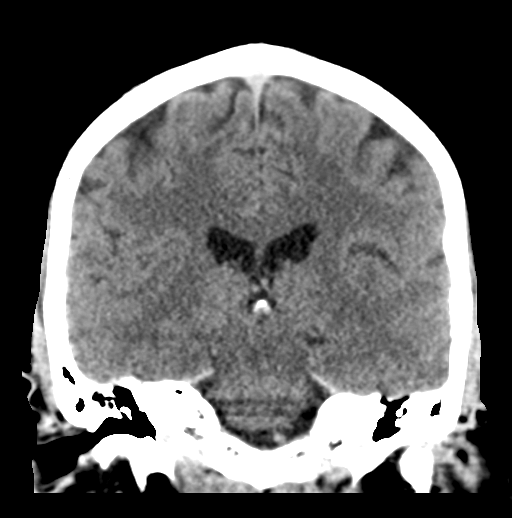
[im 41/73  brain]
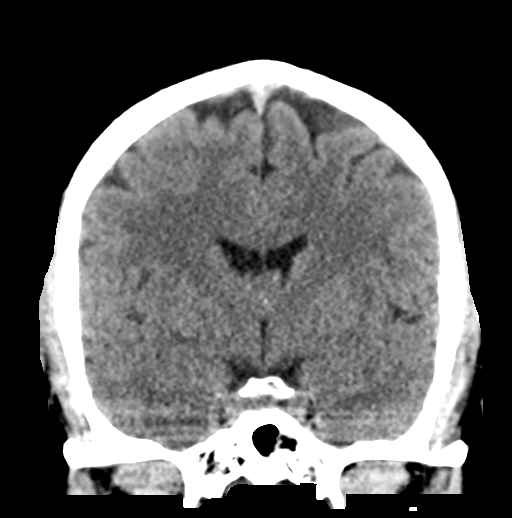

[Series 5: sagittal soft tissue · sagittal · 0.35mm/px · 3 of 56 slices shown]
[im 19/56  brain]
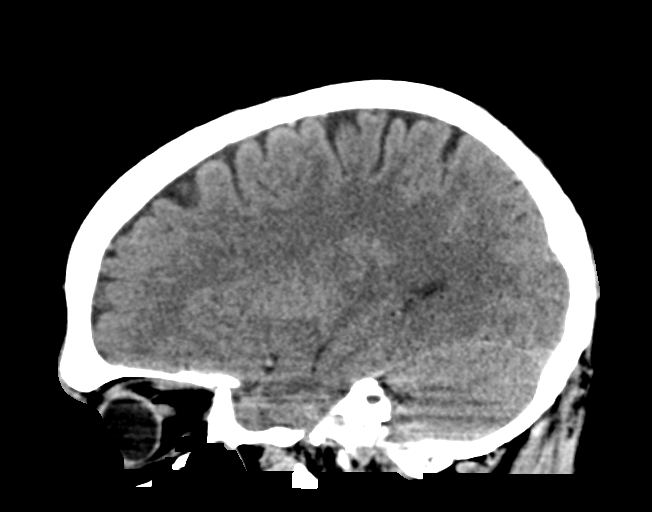
[im 28/56  brain]
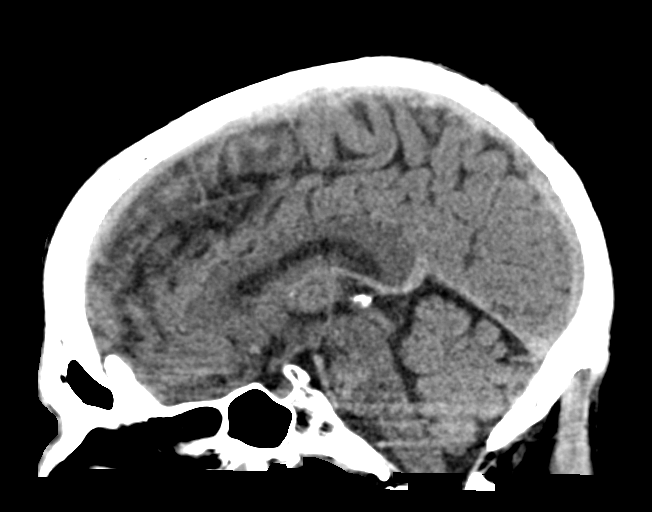
[im 37/56  brain]
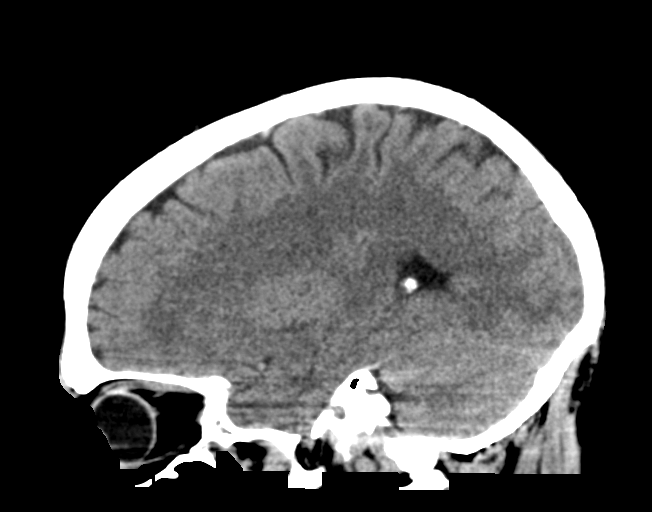

[16 of 47 positions shown; findings below may reference images not displayed]

FINDINGS: Brain: No evidence of acute infarction, hemorrhage, hydrocephalus,
extra-axial collection or mass lesion/mass effect.

Vascular: No hyperdense vessel or unexpected calcification.

Skull: Normal. Negative for fracture or focal lesion.

Sinuses/Orbits: Negative

Other: There is underway CTA of the chest, head, and neck.

ASPECTS (Alberta Stroke Program Early CT Score)

- Ganglionic level infarction (caudate, lentiform nuclei, internal
capsule, insula, M1-M3 cortex): 7

- Supraganglionic infarction (M4-M6 cortex): 3

Total score (0-10 with 10 being normal): 10
IMPRESSION: Negative head CT.  ASPECTS is 10.
